# Patient Record
Sex: Female | Born: 1995
Health system: Southern US, Community
[De-identification: ages and names within clinical notes are randomized; demographics above are authoritative.]

---
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
## 2015-12-25 ENCOUNTER — Ambulatory Visit: Payer: Medicaid Other | Admitting: Family Medicine

## 2016-05-17 ENCOUNTER — Ambulatory Visit: Payer: Medicaid Other | Admitting: Obstetrics and Gynecology

## 2018-09-24 ENCOUNTER — Other Ambulatory Visit: Payer: Self-pay | Admitting: Family Medicine

## 2018-09-24 DIAGNOSIS — Z20822 Contact with and (suspected) exposure to covid-19: Secondary | ICD-10-CM

## 2018-09-27 LAB — NOVEL CORONAVIRUS, NAA: SARS-CoV-2, NAA: NOT DETECTED

## 2019-02-07 ENCOUNTER — Other Ambulatory Visit: Payer: Self-pay

## 2019-02-07 DIAGNOSIS — Z20822 Contact with and (suspected) exposure to covid-19: Secondary | ICD-10-CM

## 2019-02-10 LAB — NOVEL CORONAVIRUS, NAA: SARS-CoV-2, NAA: NOT DETECTED

## 2019-03-21 ENCOUNTER — Encounter: Payer: Self-pay | Admitting: Obstetrics and Gynecology

## 2019-04-11 ENCOUNTER — Telehealth: Payer: Self-pay

## 2019-04-11 ENCOUNTER — Encounter: Payer: BC Managed Care – PPO | Admitting: Obstetrics and Gynecology

## 2019-04-11 NOTE — Progress Notes (Signed)
CHIEF COMPLAINT / HPI:  Chest pain Jearldean has been experiencing achy/sharp chest pain around the middle of her ribs for years.  She is decided to come in today because she believes she has recently noticed her heart occasionally skipping beats and she is concerned that she may have a heart problem.  She describes her current chest pain as an achy or dull pain that is usually around the middle of her ribs just below her breasts.  It is usually present on the right side of her chest just below her right breast and does not radiate.  Occasionally, this dull or achy pain does spread toward the center of her chest and has more recently been spreading to the left side of her chest as well directly under her breast.  This pain is not related to exertion, it is not related to eating, it occasionally gets worse with inspiration.  She has previously suspected this might be due to her large breasts or to her bras although, she believes that this problem started when she was very young before she developed breasts.  Changing the fit of her bra has reduced some of the shoulder pain that she has previously experienced (not a concern today).  She currently wears wireless bras and has been fitted for different bras previously.  She has an older sister who is currently considering having a breast reduction.  Her main concern is to make sure that she does not have a heart issue.  She realizes that her weight may be contributing to the pain she describes and is making an effort to lose weight.  She is currently being seen by bariatric doctor to help her with her weight loss goals.  Carpal tunnel syndrome This is not her primary concern, she has noted that she occasionally experiences tingling and numbness over her pointer finger and thumb of both hands.  This seems to happen a little bit more on the right side compared to the left.  This pain seems to be most prominent at night when she is lying down to go to bed although  she does notice it in the morning and takes a little while before her hands return to normal as she goes throughout her day.  PERTINENT  PMH / PSH: Obesity, anxiety, previous noncardiac chest pain   OBJECTIVE: BP 128/82   Pulse 88   LMP 01/10/2019 (LMP Unknown)   SpO2 98%    General: Well-appearing, obese young woman.  No acute distress.  Sitting comfortably on the exam table. Cardiac: Regular rate and rhythm, no murmurs/rubs/gallops.  Strong radial pulse. Chest: Reproducible tenderness with palpation along her bra line under her right breast and left breast with some tenderness to palpation over the epigastric region.  No evidence of skin changes, maceration, erythema. Neuro: Positive Phalen's test.  Negative Tinel's test  ASSESSMENT / PLAN:  Chest pain, non-cardiac Unclear etiology.  Likely MSK.  Very low suspicion for cardiac causes of this chest pain based on duration and character of the pain.  EKG was also unremarkable and showed no evidence of heart block or ischemia.  Physical exam did not support intertrigo or cellulitis.  Her history shows that she did have relief of previous shoulder pain with bra augmentation indicating that she has certainly had MSK symptoms from her macromastia.  One point against macromastia being the ultimate culprit that she believes this chest pain started before her breast development.  Ultimately, she was relieved to hear that it is incredibly  unlikely that this is heart related pain. -Unclear cause, not cardiac -Continue with weight loss goals with bariatric provider -Consider additional options for better fitting bras -Breast reduction is a possible future option  Carpal tunnel syndrome Right hand slightly more affected than the left.  Positive Phalen's test. -No responsibility clinic -Encouraged to buy wrist splints at a medical supply store or a local pharmacy -Where splints at night for at least the next 4 weeks or until symptoms improve      Matilde Haymaker, MD [place]

## 2019-04-11 NOTE — Telephone Encounter (Signed)
LVM for pt to call the office before tomorrow's appt. If pt calls, please ask her the COVID screening questions. Sunday Spillers, CMA

## 2019-04-12 ENCOUNTER — Ambulatory Visit (HOSPITAL_COMMUNITY)
Admission: RE | Admit: 2019-04-12 | Discharge: 2019-04-12 | Disposition: A | Discharge status: Home / Self Care | Payer: BC Managed Care – PPO | Source: Ambulatory Visit

## 2019-04-12 ENCOUNTER — Ambulatory Visit (HOSPITAL_COMMUNITY)
Admission: RE | Admit: 2019-04-12 | Discharge: 2019-04-12 | Disposition: A | Discharge status: Home / Self Care | Payer: BC Managed Care – PPO | Source: Ambulatory Visit | Attending: Family Medicine | Admitting: Family Medicine

## 2019-04-12 ENCOUNTER — Other Ambulatory Visit: Payer: Self-pay

## 2019-04-12 ENCOUNTER — Ambulatory Visit (INDEPENDENT_AMBULATORY_CARE_PROVIDER_SITE_OTHER): Payer: BC Managed Care – PPO | Admitting: Family Medicine

## 2019-04-12 VITALS — BP 128/82 | HR 88

## 2019-04-12 DIAGNOSIS — R002 Palpitations: Secondary | ICD-10-CM

## 2019-04-12 DIAGNOSIS — G5603 Carpal tunnel syndrome, bilateral upper limbs: Secondary | ICD-10-CM

## 2019-04-12 DIAGNOSIS — R0789 Other chest pain: Secondary | ICD-10-CM | POA: Diagnosis not present

## 2019-04-12 DIAGNOSIS — G5600 Carpal tunnel syndrome, unspecified upper limb: Secondary | ICD-10-CM | POA: Insufficient documentation

## 2019-04-12 MED ORDER — TRIAMCINOLONE ACETONIDE 0.1 % EX CREA: 1.0000 "application " | TOPICAL_CREAM | Freq: Two times a day (BID) | CUTANEOUS | 3 refills | Status: DC

## 2019-04-12 NOTE — Assessment & Plan Note (Signed)
Right hand slightly more affected than the left.  Positive Phalen's test. -No responsibility clinic -Encouraged to buy wrist splints at a medical supply store or a local pharmacy -Where splints at night for at least the next 4 weeks or until symptoms improve

## 2019-04-12 NOTE — Patient Instructions (Addendum)
I am sorry that you have been having this ongoing chest pain for so long.  Importantly, I do not think this is related to your heart.  There is not seem to be any skin changes think it is a skin problem.  Most likely, this is related to your breasts/bra.  I know you have already made adjustments to your bra fits and sizes.  If you have not done so already, I would recommend investing in several well fitted bras.  Sometimes, women who have issues related to their large breasts do elect to have breast reductions.  I am not telling you that you have to have this done I simply want you to know that this could be an option for you down the road.  Carpal tunnel syndrome: This will improve by wearing wrist splints at night.  I am sorry we do not seem to have any splints in the clinic right now.  I recommend going to any medical supply store and/or pharmacy.  Many pharmacies carry an expensive wrist splints.  I recommend wearing these wrist splints at night for at least the next 4 weeks.  Sometimes they are worn for as long as 12 weeks.

## 2019-04-12 NOTE — Assessment & Plan Note (Addendum)
Unclear etiology.  Likely MSK.  Very low suspicion for cardiac causes of this chest pain based on duration and character of the pain.  EKG was also unremarkable and showed no evidence of heart block or ischemia.  Physical exam did not support intertrigo or cellulitis.  Her history shows that she did have relief of previous shoulder pain with bra augmentation indicating that she has certainly had MSK symptoms from her macromastia.  One point against macromastia being the ultimate culprit that she believes this chest pain started before her breast development.  Ultimately, she was relieved to hear that it is incredibly unlikely that this is heart related pain. -Unclear cause, not cardiac -Continue with weight loss goals with bariatric provider -Consider additional options for better fitting bras -Breast reduction is a possible future option

## 2019-04-15 ENCOUNTER — Ambulatory Visit: Payer: BC Managed Care – PPO | Attending: Internal Medicine

## 2019-04-15 DIAGNOSIS — Z20822 Contact with and (suspected) exposure to covid-19: Secondary | ICD-10-CM | POA: Diagnosis not present

## 2019-04-16 LAB — NOVEL CORONAVIRUS, NAA: SARS-CoV-2, NAA: NOT DETECTED

## 2019-05-02 ENCOUNTER — Emergency Department (HOSPITAL_COMMUNITY)
Admission: EM | Admit: 2019-05-02 | Discharge: 2019-05-02 | Disposition: A | Discharge status: Home / Self Care | Payer: BC Managed Care – PPO | Attending: Emergency Medicine | Admitting: Emergency Medicine

## 2019-05-02 ENCOUNTER — Emergency Department (HOSPITAL_COMMUNITY): Payer: BC Managed Care – PPO

## 2019-05-02 ENCOUNTER — Encounter (HOSPITAL_COMMUNITY): Payer: Self-pay | Admitting: Emergency Medicine

## 2019-05-02 DIAGNOSIS — Z6841 Body Mass Index (BMI) 40.0 and over, adult: Secondary | ICD-10-CM | POA: Diagnosis not present

## 2019-05-02 DIAGNOSIS — G932 Benign intracranial hypertension: Secondary | ICD-10-CM

## 2019-05-02 DIAGNOSIS — E669 Obesity, unspecified: Secondary | ICD-10-CM | POA: Insufficient documentation

## 2019-05-02 DIAGNOSIS — H539 Unspecified visual disturbance: Secondary | ICD-10-CM | POA: Diagnosis not present

## 2019-05-02 DIAGNOSIS — Z20822 Contact with and (suspected) exposure to covid-19: Secondary | ICD-10-CM | POA: Insufficient documentation

## 2019-05-02 DIAGNOSIS — H4710 Unspecified papilledema: Secondary | ICD-10-CM | POA: Diagnosis not present

## 2019-05-02 DIAGNOSIS — Z79899 Other long term (current) drug therapy: Secondary | ICD-10-CM | POA: Diagnosis not present

## 2019-05-02 DIAGNOSIS — Z03818 Encounter for observation for suspected exposure to other biological agents ruled out: Secondary | ICD-10-CM | POA: Diagnosis not present

## 2019-05-02 LAB — PREGNANCY, URINE: Preg Test, Ur: NEGATIVE

## 2019-05-02 MED ORDER — GADOBUTROL 1 MMOL/ML IV SOLN
10.0000 mL | Freq: Once | INTRAVENOUS | Status: AC | PRN
  Administered 2019-05-02: 10 mL via INTRAVENOUS

## 2019-05-02 NOTE — ED Triage Notes (Signed)
Pt in POV. Sent by opthalmology for MRI, unable to schedule outpatient d/t insurance lapsing @ end of month. States they are concerned for optic nerve swelling.

## 2019-05-02 NOTE — Discharge Instructions (Addendum)
Call the family practice center to set up an appointment after you return.  Call [phone] option 3 to set up the lumbar puncture ideally for tomorrow.

## 2019-05-02 NOTE — ED Provider Notes (Signed)
[facility] EMERGENCY DEPARTMENT Provider Note   CSN: [id number] Arrival date & time: 05/02/19  1539     History Chief Complaint  Patient presents with  . Sent by Opthalmology    Rebecca Diaz is a 24 y.o. female.  HPI Patient sent in for MRI from ophthalmology.  Has swelling of her optic nerve.  Per note needs to rule out CNS mass central venous sinus thrombosis and idiopathic intracranial hypertension.  May also need short-term LP.  Has had vision changes recently but states she cannot really put a time on it because she is always had bad vision.  States she seemed sometimes to have depth perception and occasionally have a headache.  No fevers.  No urinary problems.  No confusion.    History reviewed. No pertinent past medical history.  Patient Active Problem List   Diagnosis Date Noted  . Carpal tunnel syndrome 04/12/2019  . Chest pain, non-cardiac 10/15/2014  . Recent history of foreign travel 10/15/2014  . Headache 03/11/2014  . PES PLANUS, CONGENITAL 03/29/2010  . POLYCYSTIC OVARIES 08/04/2009  . ANXIETY, SITUATIONAL 08/04/2009  . ECZEMA, ATOPIC 08/04/2009  . OTHER SPECIFIED DISEASE OF NAIL 08/04/2009  . TESTOSTERONE, SERUM, ELEVATED 07/25/2009  . AMENORRHEA, SECONDARY 07/22/2009  . OBESITY, NOS 05/04/2006    History reviewed. No pertinent surgical history.   OB History   No obstetric history on file.     No family history on file.  Social History   Tobacco Use  . Smoking status: Never Smoker  . Smokeless tobacco: Never Used  Substance Use Topics  . Alcohol use: Yes    Comment: Socially   . Drug use: Never    Home Medications Prior to Admission medications   Medication Sig Start Date End Date Taking? Authorizing Provider  Carboxymethylcellulose Sodium (THERATEARS OP) Place 1 drop into both eyes as needed (dry eyes).   Yes [provider]  Cyanocobalamin (VITAMIN B12 PO) Take 1 tablet by mouth daily.   Yes [provider]  phentermine 15 MG capsule Take 15 mg by mouth every morning.   Yes [provider]  Prenatal Vit-Fe Fumarate-FA (PRENATAL PO) Take 1-2 tablets by mouth daily.   Yes [provider]  triamcinolone cream (KENALOG) 0.1 % Apply 1 application topically 2 (two) times daily. 04/12/19  Yes Mirian Mo, MD    Allergies    Patient has no known allergies.  Review of Systems   Review of Systems  Constitutional: Negative for appetite change.  HENT: Negative for congestion.   Eyes: Positive for visual disturbance.  Respiratory: Negative for shortness of breath.   Gastrointestinal: Negative for abdominal pain.  Genitourinary: Negative for dysuria.  Musculoskeletal: Negative for back pain.  Skin: Negative for rash.  Neurological: Positive for headaches. Negative for weakness.  Psychiatric/Behavioral: Negative for confusion.    Physical Exam Updated Vital Signs BP (!) 143/91   Pulse 87   Temp 98.1 F (36.7 C) (Oral)   Resp 18   Ht 5\' 1"  (1.549 m)   Wt 131.5 kg   LMP 01/10/2019 (LMP Unknown)   SpO2 100%   BMI 54.80 kg/m   Physical Exam Vitals and nursing note reviewed.  Constitutional:      Appearance: She is obese.  HENT:     Head: Atraumatic.  Eyes:     Extraocular Movements: Extraocular movements intact.     Pupils: Pupils are equal, round, and reactive to light.  Cardiovascular:     Rate and  Rhythm: Normal rate.  Skin:    General: Skin is warm.  Neurological:     Mental Status: She is alert and oriented to person, place, and time.     ED Results / Procedures / Treatments   Labs (all labs ordered are listed, but only abnormal results are displayed) Labs Reviewed  SARS CORONAVIRUS 2 (TAT 6-24 HRS)  PREGNANCY, URINE    EKG None  Radiology MR Brain W and Wo Contrast  Result Date: 05/02/2019 CLINICAL DATA:  Optic nerve edema EXAM: MRI HEAD WITHOUT AND WITH CONTRAST MRV HEAD WITHOUT AND WITH CONTRAST TECHNIQUE: Multiplanar, multiecho  pulse sequences of the brain and surrounding structures were obtained without and with intravenous contrast. Angiographic images of the intracranial venous structures were obtained using MRV technique without and with intravenous contrast. CONTRAST:  33mL GADAVIST GADOBUTROL 1 MMOL/ML IV SOLN COMPARISON:  None. FINDINGS: MRI brain: Brain: There is no acute infarction or intracranial hemorrhage. There is no intracranial mass, mass effect, or edema. There is no hydrocephalus or extra-axial fluid collection. No abnormal enhancement. Vascular: Major vessel flow voids at the skull base are preserved. Skull and upper cervical spine: Normal marrow signal is preserved. Sinuses/Orbits: Minor mucosal thickening. Orbits are unremarkable on this nondedicated examination. Other: Sella is partially empty.  Mastoid air cells are clear. MRV head: Superior sagittal sinus, straight sinus, vein of Galen, internal cerebral veins, and both transverse and sigmoid sinuses are patent. Right transverse sinus is dominant. There is stenosis of bilateral distal transverse sinuses. No evidence of venous thrombosis. IMPRESSION: No intracranial mass. Orbits are unremarkable on this nondedicated examination. No evidence of venous thrombosis. Constellation of findings suggestive of idiopathic intracranial hypertension. Electronically Signed   By: Macy Mis M.D.   On: 05/02/2019 21:29   MR MRV HEAD W WO CONTRAST  Result Date: 05/02/2019 CLINICAL DATA:  Optic nerve edema EXAM: MRI HEAD WITHOUT AND WITH CONTRAST MRV HEAD WITHOUT AND WITH CONTRAST TECHNIQUE: Multiplanar, multiecho pulse sequences of the brain and surrounding structures were obtained without and with intravenous contrast. Angiographic images of the intracranial venous structures were obtained using MRV technique without and with intravenous contrast. CONTRAST:  26mL GADAVIST GADOBUTROL 1 MMOL/ML IV SOLN COMPARISON:  None. FINDINGS: MRI brain: Brain: There is no acute infarction  or intracranial hemorrhage. There is no intracranial mass, mass effect, or edema. There is no hydrocephalus or extra-axial fluid collection. No abnormal enhancement. Vascular: Major vessel flow voids at the skull base are preserved. Skull and upper cervical spine: Normal marrow signal is preserved. Sinuses/Orbits: Minor mucosal thickening. Orbits are unremarkable on this nondedicated examination. Other: Sella is partially empty.  Mastoid air cells are clear. MRV head: Superior sagittal sinus, straight sinus, vein of Galen, internal cerebral veins, and both transverse and sigmoid sinuses are patent. Right transverse sinus is dominant. There is stenosis of bilateral distal transverse sinuses. No evidence of venous thrombosis. IMPRESSION: No intracranial mass. Orbits are unremarkable on this nondedicated examination. No evidence of venous thrombosis. Constellation of findings suggestive of idiopathic intracranial hypertension. Electronically Signed   By: Macy Mis M.D.   On: 05/02/2019 21:29    Procedures Procedures (including critical care time)  Medications Ordered in ED Medications  gadobutrol (GADAVIST) 1 MMOL/ML injection 10 mL (10 mLs Intravenous Contrast Given 05/02/19 2112)    ED Course  I have reviewed the triage vital signs and the nursing notes.  Pertinent labs & imaging results that were available during my care of the patient were reviewed by me and  considered in my medical decision making (see chart for details).    MDM Rules/Calculators/A&P                      Patient sent in for optic disc swelling.  MRI imaging done per ophthalmology recommendations.  Shows no mass but likely shows idiopathic intracranial hypertension.  I have ordered outpatient fluoroscopic lumbar puncture.  Patient will call scheduling for procedure tomorrow.  Also discussed with family practice residents, who will follow the results and start her on medicines if pressure is elevated.  They also can arrange  follow-up.  Covid test and urine pregnancy test done although she denies pregnancy and has had a Covid test -3 weeks ago.  No Covid symptoms.  Discharge home. Final Clinical Impression(s) / ED Diagnoses Final diagnoses:  IIH (idiopathic intracranial hypertension)    Rx / DC Orders ED Discharge Orders         Ordered    DG FL GUIDED LUMBAR PUNCTURE    Comments: Patient needs opening pressure for diagnosis of idiopathic intracranial hypertension.  Potentially also could be therapeutic if pressure is elevated.   05/02/19 2207           Benjiman Core, MD 05/02/19 [phone]

## 2019-05-03 LAB — SARS CORONAVIRUS 2 (TAT 6-24 HRS): SARS Coronavirus 2: NEGATIVE

## 2019-05-09 ENCOUNTER — Encounter: Payer: BC Managed Care – PPO | Admitting: Obstetrics and Gynecology

## 2019-05-10 ENCOUNTER — Ambulatory Visit (HOSPITAL_COMMUNITY): Admission: RE | Admit: 2019-05-10 | Payer: BC Managed Care – PPO | Source: Ambulatory Visit

## 2019-05-13 DIAGNOSIS — Z20828 Contact with and (suspected) exposure to other viral communicable diseases: Secondary | ICD-10-CM | POA: Diagnosis not present

## 2019-05-20 ENCOUNTER — Ambulatory Visit (HOSPITAL_COMMUNITY)
Admission: RE | Admit: 2019-05-20 | Discharge: 2019-05-20 | Disposition: A | Discharge status: Home / Self Care | Payer: BC Managed Care – PPO | Source: Ambulatory Visit | Attending: Emergency Medicine | Admitting: Emergency Medicine

## 2019-05-20 ENCOUNTER — Other Ambulatory Visit: Payer: Self-pay

## 2019-05-20 DIAGNOSIS — H4710 Unspecified papilledema: Secondary | ICD-10-CM | POA: Diagnosis not present

## 2019-05-20 LAB — PROTEIN, CSF: Total  Protein, CSF: 21 mg/dL (ref 15–45)

## 2019-05-20 LAB — GLUCOSE, CSF: Glucose, CSF: 61 mg/dL (ref 40–70)

## 2019-05-20 MED ORDER — LIDOCAINE HCL (PF) 1 % IJ SOLN: 5.0000 mL | Freq: Once | INTRAMUSCULAR | Status: DC

## 2019-05-20 NOTE — Progress Notes (Signed)
Discharge instructions reviewed with patient and family. Verbalized understanding. 

## 2019-05-20 NOTE — Discharge Instructions (Signed)
Lumbar Puncture, Care After This sheet gives you information about how to care for yourself after your procedure. Your health care provider may also give you more specific instructions. If you have problems or questions, contact your health care provider. What can I expect after the procedure? After the procedure, it is common to have:  Mild discomfort or pain at the puncture site.  A mild headache that is relieved with pain medicines. Follow these instructions at home: Activity   Lie down flat or rest for as long as directed by your health care provider.  Return to your normal activities as told by your health care provider. Ask your health care provider what activities are safe for you.  Avoid lifting anything heavier than 10 lb (4.5 kg) for at least 12 hours after the procedure.  Do not drive for 24 hours if you were given a medicine to help you relax (sedative) during your procedure.  Do not drive or use heavy machinery while taking prescription pain medicine. Puncture site care  Remove or change your bandage (dressing) as told by your health care provider.  Check your puncture area every day for signs of infection. Check for: ? More pain. ? Redness or swelling. ? Fluid or blood leaking from the puncture site. ? Warmth. ? Pus or a bad smell. General instructions  Take over-the-counter and prescription medicines only as told by your health care provider.  Drink enough fluids to keep your urine clear or pale yellow. Your health care provider may recommend drinking caffeine to prevent a headache.  Keep all follow-up visits as told by your health care provider. This is important. Contact a health care provider if:  You have fever or chills.  You have nausea or vomiting.  You have a headache that lasts for more than 2 days or does not get better with medicine. Get help right away if:  You develop any of the following in your  legs: ? Weakness. ? Numbness. ? Tingling.  You are unable to control when you urinate or have a bowel movement (incontinence).  You have signs of infection around your puncture site, such as: ? More pain. ? Redness or swelling. ? Fluid or blood leakage. ? Warmth. ? Pus or a bad smell.  You are dizzy or you feel like you might faint.  You have a severe headache, especially when you sit or stand. Summary  A lumbar puncture is a procedure in which a small needle is inserted into the lower back to remove fluid that surrounds the brain and spinal cord.  After this procedure, it is common to have a headache and pain around the needle insertion area.  Lying flat, staying hydrated, and drinking caffeine can help prevent headaches.  Monitor your needle insertion site for signs of infection, including warmth, fluid, or more pain.  Get help right away if you develop leg weakness, leg numbness, incontinence, or severe headaches. This information is not intended to replace advice given to you by your health care provider. Make sure you discuss any questions you have with your health care provider. Document Revised: 04/06/2016 Document Reviewed: 04/06/2016 Elsevier Patient Education  2020 [employer]vier Inc.  

## 2019-05-20 NOTE — Procedures (Signed)
Reason for procedure: edema of optic disc, concern for idiopathic intracranial hypertension  Successful fluoro-guided LP at L1-2 Opening pressure 22 cm water 21 mL clear CSF withdrawn and sent for laboratory eval Closing pressure 5 cm water No immediate complications  Rito Ehrlich, MD

## 2019-05-21 LAB — CSF CELL COUNT WITH DIFFERENTIAL
RBC Count, CSF: 0 /mm3
Tube #: 3
WBC, CSF: 0 /mm3 (ref 0–5)

## 2019-05-23 LAB — CSF CULTURE W GRAM STAIN
Culture: NO GROWTH
Gram Stain: NONE SEEN

## 2019-05-30 DIAGNOSIS — H4710 Unspecified papilledema: Secondary | ICD-10-CM | POA: Diagnosis not present

## 2019-06-04 ENCOUNTER — Other Ambulatory Visit: Payer: Self-pay

## 2019-06-04 ENCOUNTER — Encounter: Payer: Self-pay | Admitting: Family Medicine

## 2019-06-04 ENCOUNTER — Ambulatory Visit (INDEPENDENT_AMBULATORY_CARE_PROVIDER_SITE_OTHER): Payer: BC Managed Care – PPO | Admitting: Family Medicine

## 2019-06-04 ENCOUNTER — Other Ambulatory Visit (HOSPITAL_COMMUNITY)
Admission: RE | Admit: 2019-06-04 | Discharge: 2019-06-04 | Disposition: A | Discharge status: Home / Self Care | Payer: BC Managed Care – PPO | Source: Ambulatory Visit | Attending: Family Medicine | Admitting: Family Medicine

## 2019-06-04 VITALS — BP 110/80 | HR 82 | Temp 98.2°F | Wt 290.0 lb

## 2019-06-04 DIAGNOSIS — Z124 Encounter for screening for malignant neoplasm of cervix: Secondary | ICD-10-CM | POA: Diagnosis not present

## 2019-06-04 DIAGNOSIS — Z23 Encounter for immunization: Secondary | ICD-10-CM | POA: Diagnosis not present

## 2019-06-04 DIAGNOSIS — Z0000 Encounter for general adult medical examination without abnormal findings: Secondary | ICD-10-CM

## 2019-06-04 DIAGNOSIS — G932 Benign intracranial hypertension: Secondary | ICD-10-CM

## 2019-06-04 DIAGNOSIS — R5383 Other fatigue: Secondary | ICD-10-CM | POA: Diagnosis not present

## 2019-06-04 NOTE — Patient Instructions (Signed)
It was great seeing you today!   I'd like to see you back in 6 months for follow up but if you need to be seen earlier than that for any new issues we're happy to fit you in, just give [place] a call!  - It is important for you to KEEP your opthalmology appointments even if you lose insurance!!!!  - I will send you a myChart message regarding you labs in the next week.    If you have questions or concerns please do not hesitate to call at [phone].  Dr. Katherina [facility]

## 2019-06-04 NOTE — Progress Notes (Signed)
    SUBJECTIVE:   CHIEF COMPLAINT / HPI:   Rebecca Diaz is a 24 y.o. female with history of PCOS here for follow up and PAP smear.  Idiopathic intracranial hypertension Seen in ED and was told she has "pressure on optic nerves." Seen opthalmology but is losing her health insurance.  States she "doesn't want to lose my vision for another 10 years" and her "mom lost her vision" at an early age.  Denies headaches and changes in vision. Had an LP and would like to know the results. Has PCOS.     Obesity  Patient trying to lose weight and follows with a bariatric clinic. Has lost 5-10 lbs since February.  Working on exercising and altering her diet. Reports fatigue. Has familiar history of sickle cell trait.   PERTINENT  PMH / PSH: PCOS, idiopathic intracranial hypertension   OBJECTIVE:   Wt 290 lb (131.5 kg)   LMP 05/17/2019   BMI 54.80 kg/m    GEN: pleasant female, in no acute distress  CV: regular rate and rhythm, no murmurs appreciated  RESP: no increased work of breathing, clear to ascultation bilaterally  ABD:  Obese abdomen, bowel sounds present. Soft, Nontender, Nondistended.  MSK: normal ROM, non-tender, strength 5/5 bilateral LE and UE  SKIN: warm, dry, no rash NEURO: normal tone, CN 2-12 grossly intact  PSYCH: Normal affect and thought content  Pelvic: Pelvic exam: VULVA: normal appearing vulva with no masses, tenderness or lesions, VAGINA: normal appearing vagina with normal color, vaginal bleeding, clots near cervical os, CERVIX: normal appearing cervix without discharge or lesions, ADNEXA/UTERUS: non-tender, grossly normal, no palpable masses though exam limited due to body habitus, PAP: Pap smear done today, exam chaperoned by CMA.   ASSESSMENT/PLAN:   Morbid obesity (HCC) Obtain lipid panel, a1c and CMP.  Patient follows with bariatric clinic and is considering weight loss surgery.     Idiopathic intracranial hypertension Reviewed CSF results negative and were  reviewed with patient.  Advised patient to keep her ophthalmology appointments to prevent blindness due to papilledema.  Patient agrees with plan.    PAP smear obtained and HIV test ordered.  Tetanus vaccination given.   Katha Cabal, DO [facility]

## 2019-06-04 NOTE — Assessment & Plan Note (Signed)
Obtain lipid panel, a1c and CMP.  Patient follows with bariatric clinic and is considering weight loss surgery.

## 2019-06-05 LAB — COMPREHENSIVE METABOLIC PANEL
ALT: 8 IU/L (ref 0–32)
AST: 14 IU/L (ref 0–40)
Albumin/Globulin Ratio: 1.4 (ref 1.2–2.2)
Albumin: 4.5 g/dL (ref 3.9–5.0)
Alkaline Phosphatase: 91 IU/L (ref 39–117)
BUN/Creatinine Ratio: 11 (ref 9–23)
BUN: 10 mg/dL (ref 6–20)
Bilirubin Total: 0.2 mg/dL (ref 0.0–1.2)
CO2: 21 mmol/L (ref 20–29)
Calcium: 9.9 mg/dL (ref 8.7–10.2)
Chloride: 103 mmol/L (ref 96–106)
Creatinine, Ser: 0.87 mg/dL (ref 0.57–1.00)
GFR calc Af Amer: 108 mL/min/{1.73_m2} (ref >59)
GFR calc non Af Amer: 94 mL/min/{1.73_m2} (ref >59)
Globulin, Total: 3.3 g/dL (ref 1.5–4.5)
Glucose: 74 mg/dL (ref 65–99)
Potassium: 4.4 mmol/L (ref 3.5–5.2)
Sodium: 139 mmol/L (ref 134–144)
Total Protein: 7.8 g/dL (ref 6.0–8.5)

## 2019-06-05 LAB — CBC WITH DIFFERENTIAL/PLATELET
Basophils Absolute: 0.1 10*3/uL (ref 0.0–0.2)
Basos: 1 %
EOS (ABSOLUTE): 0.1 10*3/uL (ref 0.0–0.4)
Eos: 1 %
Hematocrit: 39.5 % (ref 34.0–46.6)
Hemoglobin: 12.5 g/dL (ref 11.1–15.9)
Immature Grans (Abs): 0 10*3/uL (ref 0.0–0.1)
Immature Granulocytes: 0 %
Lymphocytes Absolute: 4.8 10*3/uL — ABNORMAL HIGH (ref 0.7–3.1)
Lymphs: 44 %
MCH: 22.6 pg — ABNORMAL LOW (ref 26.6–33.0)
MCHC: 31.6 g/dL (ref 31.5–35.7)
MCV: 71 fL — ABNORMAL LOW (ref 79–97)
Monocytes Absolute: 0.5 10*3/uL (ref 0.1–0.9)
Monocytes: 5 %
Neutrophils Absolute: 5.5 10*3/uL (ref 1.4–7.0)
Neutrophils: 49 %
Platelets: 483 10*3/uL — ABNORMAL HIGH (ref 150–450)
RBC: 5.53 x10E6/uL — ABNORMAL HIGH (ref 3.77–5.28)
RDW: 18.8 % — ABNORMAL HIGH (ref 11.7–15.4)
WBC: 11 10*3/uL — ABNORMAL HIGH (ref 3.4–10.8)

## 2019-06-05 LAB — HEMOGLOBIN A1C
Est. average glucose Bld gHb Est-mCnc: 111 mg/dL
Hgb A1c MFr Bld: 5.5 % (ref 4.8–5.6)

## 2019-06-05 LAB — LIPID PANEL
Chol/HDL Ratio: 4.4 ratio (ref 0.0–4.4)
Cholesterol, Total: 185 mg/dL (ref 100–199)
HDL: 42 mg/dL (ref >39)
LDL Chol Calc (NIH): 126 mg/dL — ABNORMAL HIGH (ref 0–99)
Triglycerides: 90 mg/dL (ref 0–149)
VLDL Cholesterol Cal: 17 mg/dL (ref 5–40)

## 2019-06-05 LAB — HIV ANTIBODY (ROUTINE TESTING W REFLEX): HIV Screen 4th Generation wRfx: NONREACTIVE

## 2019-06-06 DIAGNOSIS — G932 Benign intracranial hypertension: Secondary | ICD-10-CM | POA: Insufficient documentation

## 2019-06-06 LAB — CYTOLOGY - PAP
Chlamydia: NEGATIVE
Comment: NEGATIVE
Comment: NEGATIVE
Comment: NORMAL
Diagnosis: NEGATIVE
High risk HPV: NEGATIVE
Neisseria Gonorrhea: NEGATIVE

## 2019-06-06 NOTE — Assessment & Plan Note (Signed)
Reviewed CSF results negative and were reviewed with patient.  Advised patient to keep her ophthalmology appointments to prevent blindness due to papilledema.  Patient agrees with plan.

## 2019-06-27 DIAGNOSIS — H4710 Unspecified papilledema: Secondary | ICD-10-CM | POA: Diagnosis not present

## 2019-07-03 DIAGNOSIS — G932 Benign intracranial hypertension: Secondary | ICD-10-CM | POA: Diagnosis not present

## 2021-08-10 ENCOUNTER — Encounter: Payer: Self-pay | Admitting: *Deleted

## 2022-01-10 IMAGING — MR MR MRV HEAD WO/W CM
4 series · 48 of 48 positions shown · IV contrast (gadavist)
Comparison: None.

CLINICAL DATA: Optic nerve edema

EXAM:
MRI HEAD WITHOUT AND WITH CONTRAST
MRV HEAD WITHOUT AND WITH CONTRAST
TECHNIQUE: Multiplanar, multiecho pulse sequences of the brain and surrounding
structures were obtained without and with intravenous contrast.
Angiographic images of the intracranial venous structures were
obtained using MRV technique without and with intravenous contrast.
CONTRAST:  10mL GADAVIST GADOBUTROL 1 MMOL/ML IV SOLN

[Series 1: tof_fl2d_paracor · coronal · 2.0mm · 0.98mm/px · 10 of 155 slices shown]
[im 1/155]
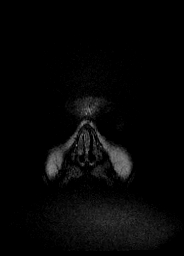
[im 18/155]
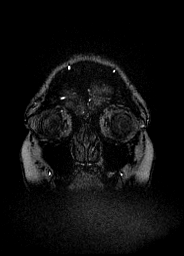
[im 35/155]
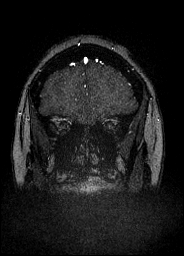
[im 52/155]
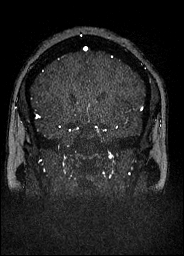
[im 69/155]
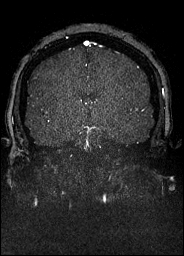
[im 86/155]
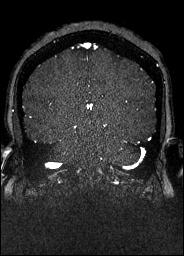
[im 103/155]
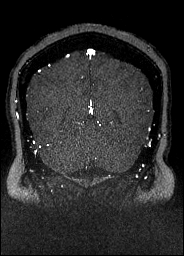
[im 120/155]
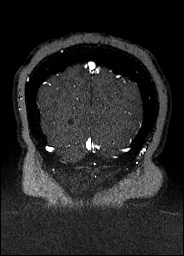
[im 137/155]
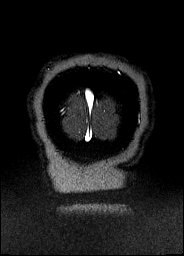
[im 155/155]
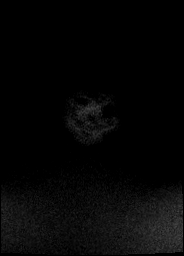

[Series 6: venous inhance coronal_msum · coronal · portal-venous · 0.9mm · 0.57mm/px · 15 of 224 slices shown]
[im 1/224]
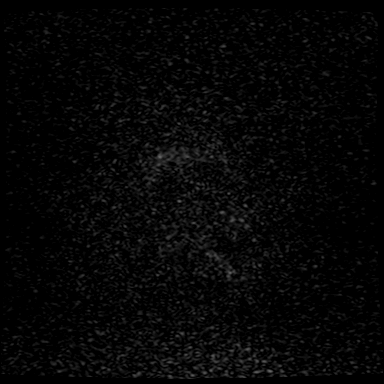
[im 16/224]
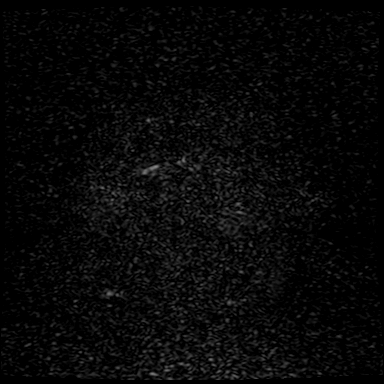
[im 32/224]
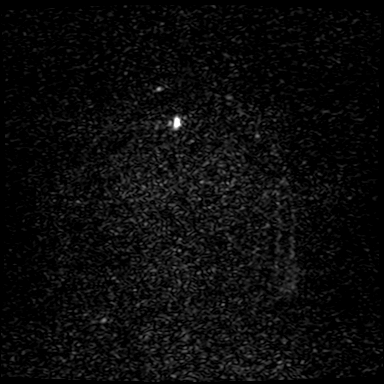
[im 48/224]
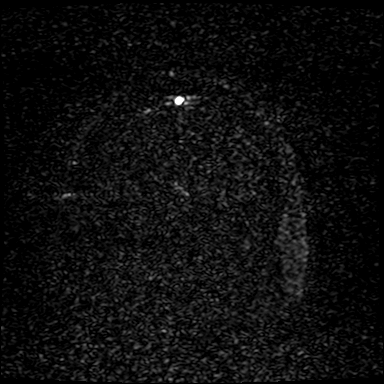
[im 64/224]
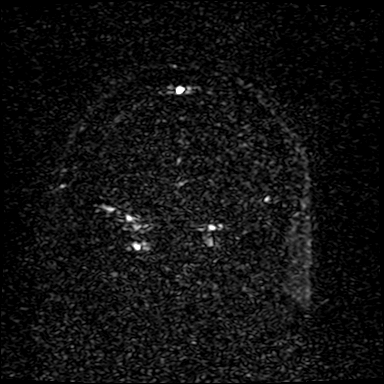
[im 80/224]
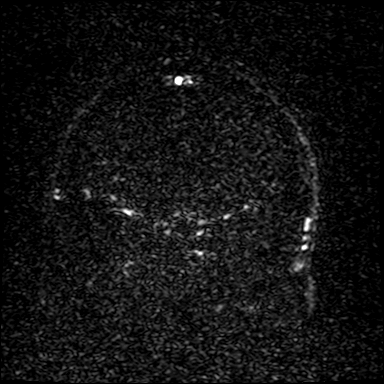
[im 96/224]
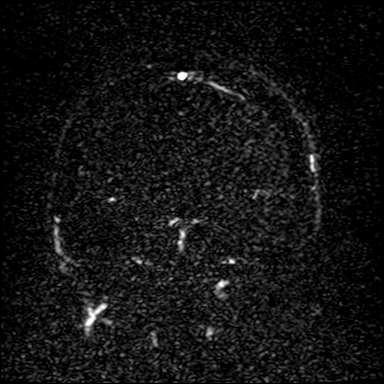
[im 112/224]
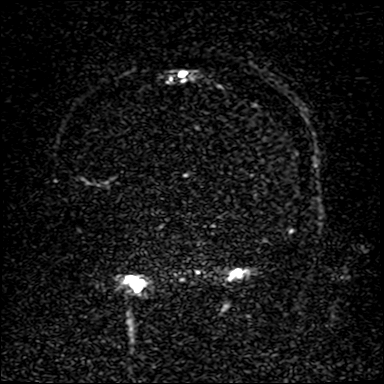
[im 128/224]
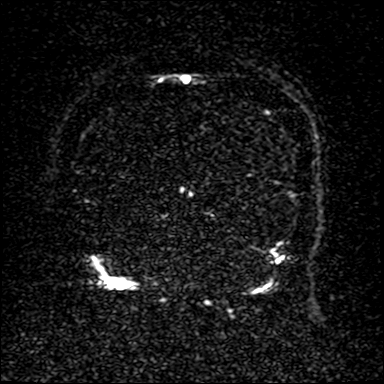
[im 144/224]
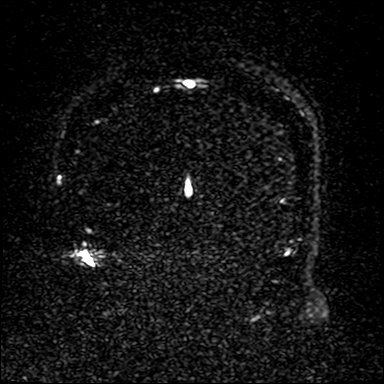
[im 160/224]
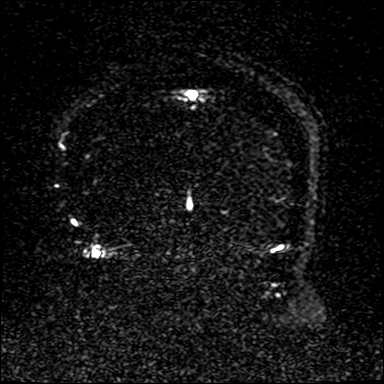
[im 176/224]
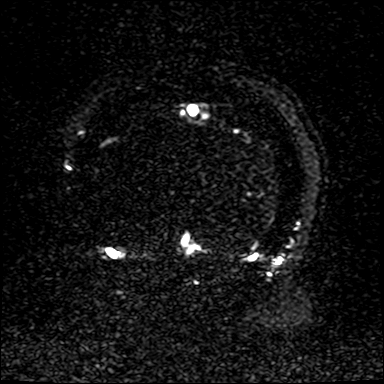
[im 192/224]
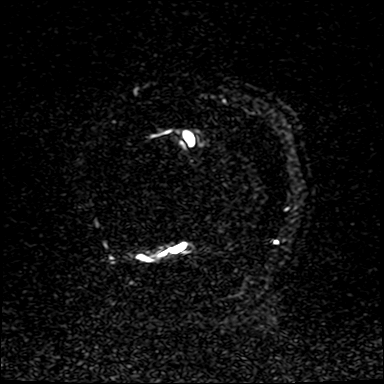
[im 208/224]
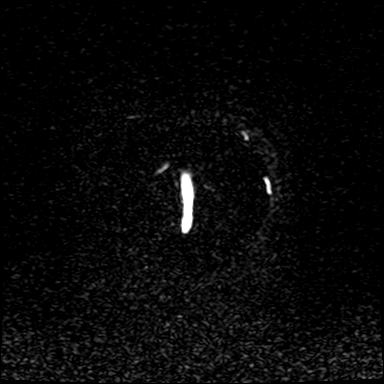
[im 224/224]
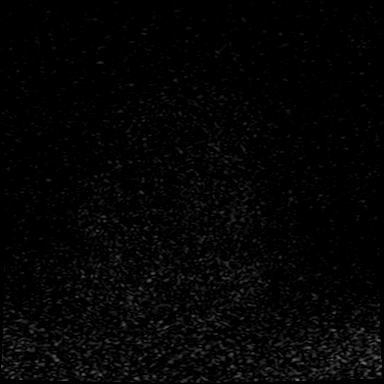

[Series 9: t1_fl3d_sag_p2_iso · sagittal · 1.0mm · 0.98mm/px · 12 of 176 slices shown]
[im 1/176]
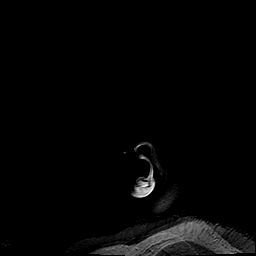
[im 16/176]
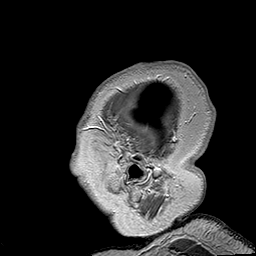
[im 32/176]
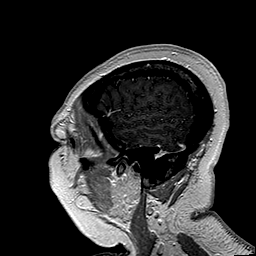
[im 48/176]
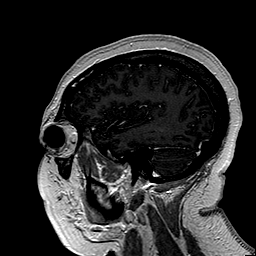
[im 64/176]
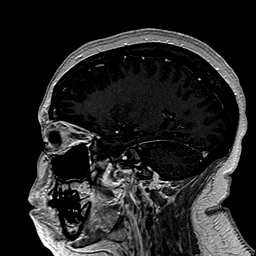
[im 80/176]
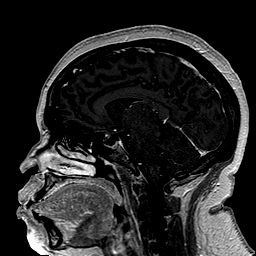
[im 96/176]
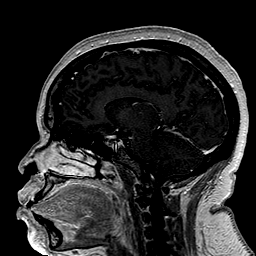
[im 112/176]
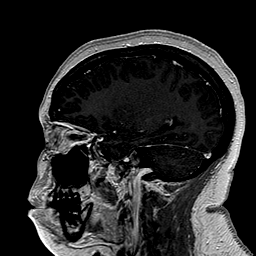
[im 128/176]
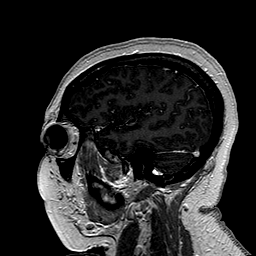
[im 144/176]
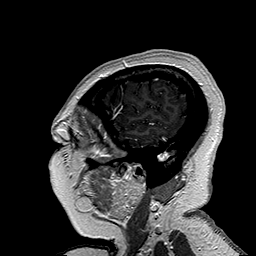
[im 160/176]
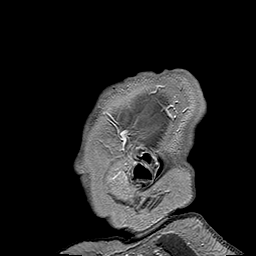
[im 176/176]
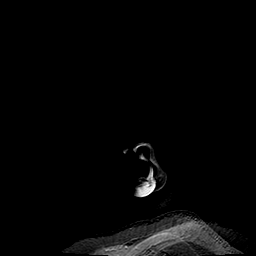

[Series 10: t1_fl3d_tra_p2_iso · axial · 1.0mm · 0.98mm/px · z∈[-96,+60]mm · 11 of 160 slices shown]
[im 1/160]
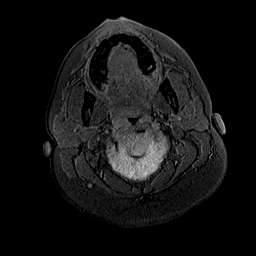
[im 16/160]
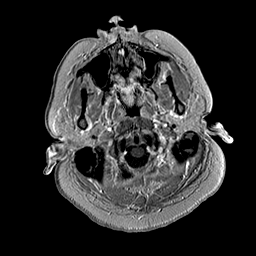
[im 32/160]
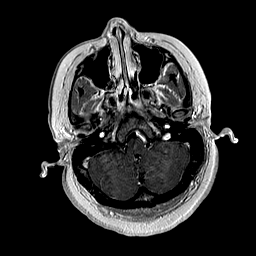
[im 48/160]
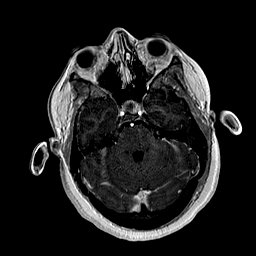
[im 64/160]
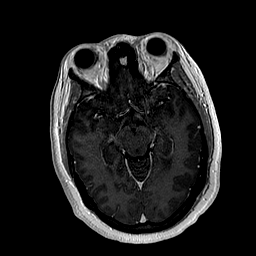
[im 80/160]
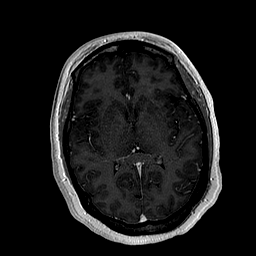
[im 96/160]
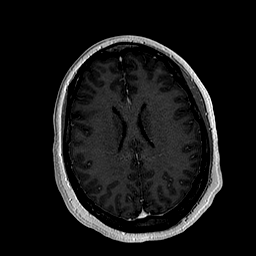
[im 112/160]
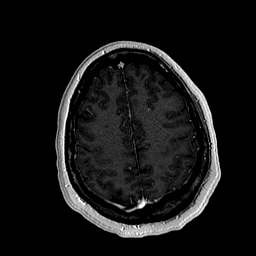
[im 128/160]
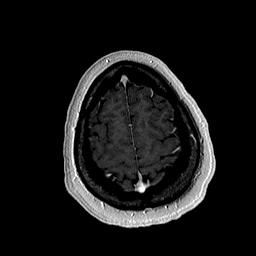
[im 144/160]
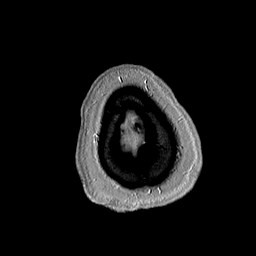
[im 160/160]
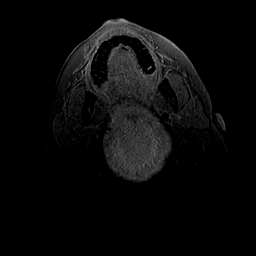

[48 of 48 positions shown; findings below may reference images not displayed]

FINDINGS: MRI brain:

Brain: There is no acute infarction or intracranial hemorrhage.
There is no intracranial mass, mass effect, or edema. There is no
hydrocephalus or extra-axial fluid collection. No abnormal
enhancement.

Vascular: Major vessel flow voids at the skull base are preserved.

Skull and upper cervical spine: Normal marrow signal is preserved.

Sinuses/Orbits: Minor mucosal thickening. Orbits are unremarkable on
this nondedicated examination.

Other: Sella is partially empty.  Mastoid air cells are clear.

MRV head:

Superior sagittal sinus, straight sinus, vein of [place], internal
cerebral veins, and both transverse and sigmoid sinuses are patent.
Right transverse sinus is dominant. There is stenosis of bilateral
distal transverse sinuses. No evidence of venous thrombosis.
IMPRESSION: No intracranial mass. Orbits are unremarkable on this nondedicated
examination. No evidence of venous thrombosis.

Constellation of findings suggestive of idiopathic intracranial
hypertension.

## 2022-01-28 IMAGING — RF DG SPINAL PUNCT LUMBAR DIAG WITH FL CT GUIDANCE
1 series · 1 of 1 positions shown · non-contrast
Comparison: none

CLINICAL DATA: Edema of optic disc, possible idiopathic
intracranial hypertension.

[Series 1: cp_standard · 0.17mm/px · 1 of 1 slices shown]
[im 1/1]
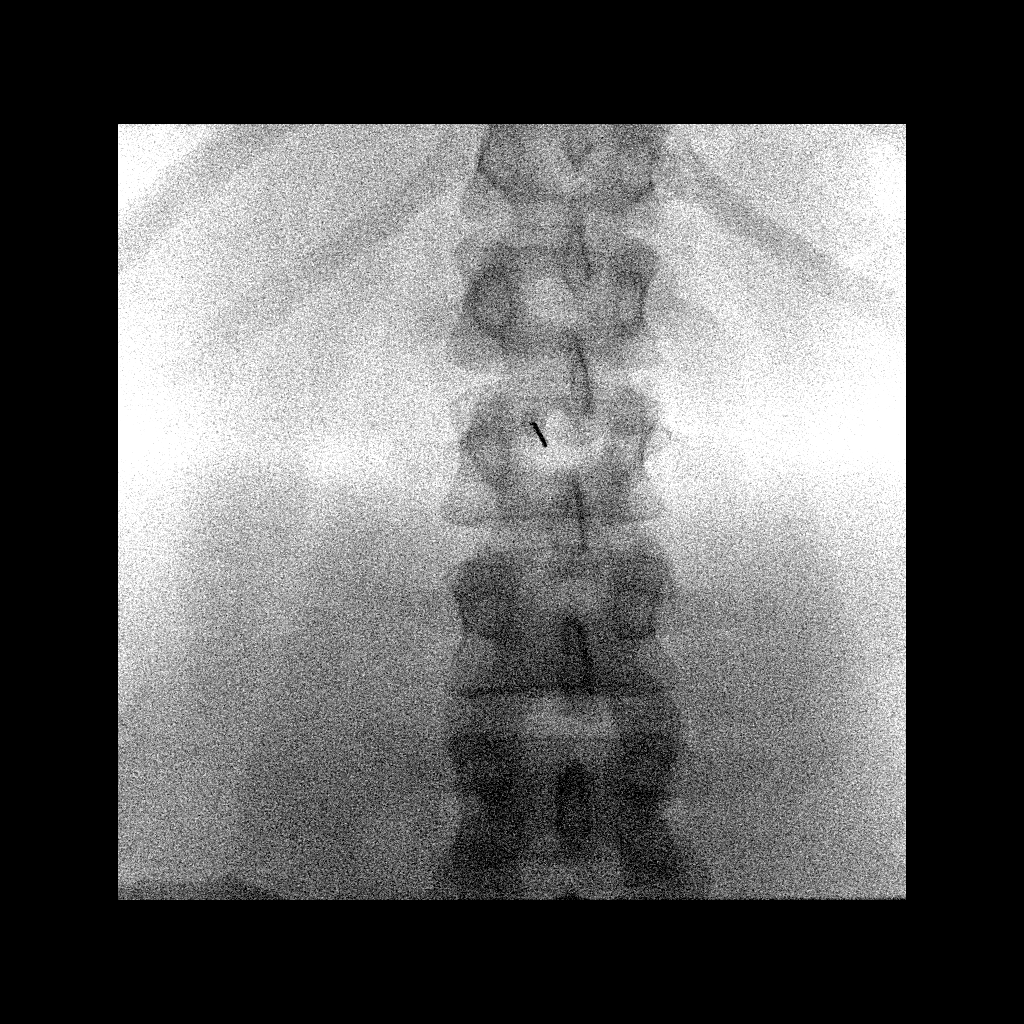

[1 of 1 positions shown; findings below may reference images not displayed]

EXAM:
DIAGNOSTIC LUMBAR PUNCTURE UNDER FLUOROSCOPIC GUIDANCE

FLUOROSCOPY TIME:  Fluoroscopy Time:  12 seconds

Radiation Exposure Index (if provided by the fluoroscopic device):
2.2 mGy

Number of Acquired Spot Images: 1

PROCEDURE:
Informed consent was obtained from the patient prior to the
procedure, including potential complications of headache, allergy,
and pain. With the patient prone, the lower back was prepped with
Betadine. 1% Lidocaine was used for local anesthesia. Lumbar
puncture was performed at the L1-2 level using a 20 gauge needle
with return of clear CSF with an opening pressure of 22 cm water. 21
ml of CSF were obtained for laboratory studies. Closing pressure 5
cm water. The patient tolerated the procedure well and there were no
apparent complications.
IMPRESSION: Successful fluoroscopic guided lumbar puncture, as above.

## 2022-06-15 ENCOUNTER — Encounter: Payer: Self-pay | Admitting: Student

## 2022-06-15 ENCOUNTER — Ambulatory Visit: Payer: Medicaid Other | Admitting: Student

## 2022-06-15 VITALS — BP 118/78 | HR 80 | Ht 61.0 in | Wt 288.5 lb

## 2022-06-15 DIAGNOSIS — G932 Benign intracranial hypertension: Secondary | ICD-10-CM | POA: Diagnosis present

## 2022-06-15 DIAGNOSIS — L309 Dermatitis, unspecified: Secondary | ICD-10-CM | POA: Diagnosis not present

## 2022-06-15 DIAGNOSIS — Z1159 Encounter for screening for other viral diseases: Secondary | ICD-10-CM | POA: Diagnosis not present

## 2022-06-15 DIAGNOSIS — Z23 Encounter for immunization: Secondary | ICD-10-CM

## 2022-06-15 MED ORDER — TRIAMCINOLONE ACETONIDE 0.1 % EX CREA: 1.0000 | TOPICAL_CREAM | Freq: Two times a day (BID) | CUTANEOUS | 3 refills | Status: DC

## 2022-06-15 NOTE — Addendum Note (Signed)
Addended by: Aquilla Solian on: 06/15/2022 10:18 AM   Modules accepted: Orders

## 2022-06-15 NOTE — Progress Notes (Signed)
  SUBJECTIVE:   CHIEF COMPLAINT / HPI:   IIH Previously diagnosed with papilledema and had a normal opening pressure on LP (22) a couple of years ago.  MRI at that time was showing concern for IIH.  She was started on Diamox 250 mg 2 tabs twice a day for which she reports her ophthalmologist is controlling.  She was on an increased dosage for approximately 6 months after diagnosis but had reduced due to nausea and side effects.  Patient sees Dr. Clelia Croft with [facility].  Never seen a neurologist.  She reports that she has a problem with depth perception but relates this to her astigmatism.  Otherwise she has no acute neurologic concerns.  Healthcare maintenance Patient is due for Pap smear and hepatitis C screening.  Amenable to hep C today  Eczema Patient has worsening eczema in the summertime.  She is requesting a refill for Kenalog.  PERTINENT  PMH / PSH:   Obesity IIH  Patient Care Team: Alfredo Martinez, MD as PCP - General (Family Medicine) OBJECTIVE:  BP 118/78   Pulse 80   Ht 5\' 1"  (1.549 m)   Wt 288 lb 8 oz (130.9 kg)   LMP 06/08/2022   SpO2 100%   BMI 54.51 kg/m  Physical Exam  General: NAD, pleasant, able to participate in exam Card: RRR Respiratory: No respiratory distress Skin: warm and dry, no rashes noted Psych: Normal affect and mood Neuro: CN II: PERRL CN III, IV,VI: EOMI CV V: Normal sensation in face CVII: Symmetric smile and brow raise CN VIII: Normal hearing CN XI: 5/5 shoulder shrug CN XII: Symmetric tongue protrusion  UE and LE strength 5/5 Normal sensation in UE and LE bilaterally  No ataxia with finger to nose, normal heel to shin  Negative Rhomberg    ASSESSMENT/PLAN:  Idiopathic intracranial hypertension Assessment & Plan: Reassuringly with normal neurologic exam.  However, unsure if patient simply has papilledema for which she is being treated by ophthalmology or has IIH.  Regardless, Diamox has multiple possible side effects and would like  neurology to see her for assessment.  Patient has normal neurologic exam and has no neurologic complaints.  She can continue with her current dosage of Diamox at this time.  Will obtain BMP to assess lytes.  Follow-up in 3 weeks.   Orders: -     Ambulatory referral to Neurology -     Basic metabolic panel  Need for hepatitis C screening test -     Hepatitis C antibody  Eczema, unspecified type Assessment & Plan: Refill for Kenalog provided, discussed risk of hypopigmentation and limiting use.  Patient reports that she is well aware of the side effects.  No current eczematous lesions present.  Orders: -     Triamcinolone Acetonide; Apply 1 Application topically 2 (two) times daily.  Dispense: 45 g; Refill: 3   Return in about 4 weeks (around 07/13/2022) for pap smear. Alfredo Martinez, MD 06/15/2022, 10:14 AM PGY-2, [facility]

## 2022-06-15 NOTE — Patient Instructions (Addendum)
It was great to see you today! Thank you for choosing [facility] for your primary care. Rebecca Diaz was seen for follow up.  Today we addressed: -I have placed a referral to neurology  -They will call in the next two weeks -We will grab some labs today  -I will see you for your pap in 3-4 weeks, will get HPV vaccine then   If you haven't already, sign up for My Chart to have easy access to your labs results, and communication with your primary care physician.  I recommend that you always bring your medications to each appointment as this makes it easy to ensure you are on the correct medications and helps [place] not miss refills when you need them. Call the clinic at [phone] if your symptoms worsen or you have any concerns.  You should return to our clinic Return in about 4 weeks (around 07/13/2022) for pap smear. Please arrive 15 minutes before your appointment to ensure smooth check in process.  We appreciate your efforts in making this happen.  Thank you for allowing me to participate in your care, Alfredo Martinez, MD 06/15/2022, 9:42 AM PGY-2, [facility]

## 2022-06-15 NOTE — Assessment & Plan Note (Signed)
Refill for Kenalog provided, discussed risk of hypopigmentation and limiting use.  Patient reports that she is well aware of the side effects.  No current eczematous lesions present.

## 2022-06-15 NOTE — Assessment & Plan Note (Signed)
Reassuringly with normal neurologic exam.  However, unsure if patient simply has papilledema for which she is being treated by ophthalmology or has IIH.  Regardless, Diamox has multiple possible side effects and would like neurology to see her for assessment.  Patient has normal neurologic exam and has no neurologic complaints.  She can continue with her current dosage of Diamox at this time.  Will obtain BMP to assess lytes.  Follow-up in 3 weeks.

## 2022-06-16 LAB — BASIC METABOLIC PANEL
BUN/Creatinine Ratio: 13 (ref 9–23)
BUN: 12 mg/dL (ref 6–20)
CO2: 15 mmol/L — ABNORMAL LOW (ref 20–29)
Calcium: 9.5 mg/dL (ref 8.7–10.2)
Chloride: 112 mmol/L — ABNORMAL HIGH (ref 96–106)
Creatinine, Ser: 0.91 mg/dL (ref 0.57–1.00)
Glucose: 79 mg/dL (ref 70–99)
Potassium: 4.2 mmol/L (ref 3.5–5.2)
Sodium: 140 mmol/L (ref 134–144)
eGFR: 89 mL/min/{1.73_m2} (ref >59)

## 2022-06-16 LAB — HEPATITIS C ANTIBODY: Hep C Virus Ab: NONREACTIVE

## 2022-07-06 ENCOUNTER — Encounter: Payer: Self-pay | Admitting: Neurology

## 2022-07-25 ENCOUNTER — Ambulatory Visit: Payer: Medicaid Other

## 2022-08-08 ENCOUNTER — Encounter: Payer: Self-pay | Admitting: Neurology

## 2022-08-08 ENCOUNTER — Ambulatory Visit (INDEPENDENT_AMBULATORY_CARE_PROVIDER_SITE_OTHER): Payer: Medicaid Other | Admitting: Neurology

## 2022-08-08 VITALS — BP 111/66 | HR 74 | Resp 18 | Ht 61.0 in | Wt 285.0 lb

## 2022-08-08 DIAGNOSIS — G932 Benign intracranial hypertension: Secondary | ICD-10-CM | POA: Diagnosis not present

## 2022-08-08 NOTE — Patient Instructions (Signed)
Good to meet you. Proceed with formal eye exam with Dr. Sherryll Burger. Let me know if you would like me to take over acetazolamide. Follow-up in 6 months, call for any changes.

## 2022-08-08 NOTE — Progress Notes (Signed)
NEUROLOGY CONSULTATION NOTE  Rebecca Diaz MRN: [id number] DOB: 29-Mar-1995  Referring provider: Dr. Burley Saver Primary care provider: Dr. Alfredo Martinez  Reason for consult:  idiopathic intracranial hypertension  Dear Dr Miquel Dunn:  Thank you for your kind referral of Rebecca Diaz for consultation of the above symptoms. Although her history is well known to you, please allow me to reiterate it for the purpose of our medical record. She is alone in the office today. Records and images were personally reviewed where available.   HISTORY OF PRESENT ILLNESS: This is a pleasant 27 year old ambidextrous woman presenting for evaluation of Idiopathic Intracranial Hypertension (IIH). She denies having any symptoms and was going for her routine eye exam in 04/2019 when her optometrist found papilledema. She was evaluated by ophthalmologist Dr. Georges Mouse, notes unavailable for review but per ER records she was sent to the ER for an MRI due to swelling of her optic nerve. MRI brain with and without contrast no acute changes, there was partially empty sella. MRV head showed stenosis of bilateral distal transverse sinuses, constellation of findings suggestive of IIH. She had an opening pressure of 22 cm H2O in 05/2019. She was started on acetazolamide 500mg  BID. She recalls that when dose was reduced 1.5 years ago, Dr. Sherryll Burger told her "some results on eye exam changed," and dose increased back to 500mg  BID. She denies any vision changes, "just astigmatism," no vision loss. She denies any headaches, dizziness, nausea/vomiting, focal numbness/tingling/weakness. She has occasional pulsatile tinnitus that is more prominent in the winter and goes away in the summer. When she turns her head to the right, the tinnitus goes away. She initially had paresthesias on the acetazolamide, but this has resolved. She reports her last eye exam was in 2023 and "eyes looked fine," report unavailable for review. She notes some  blurred vision in the morning that gets better, possibly due to dry eye. Sleep is good.     Chemistry      Component Value Date/Time   NA 140 06/15/2022 1049   K 4.2 06/15/2022 1049   CL 112 (H) 06/15/2022 1049   CO2 15 (L) 06/15/2022 1049   BUN 12 06/15/2022 1049   CREATININE 0.91 06/15/2022 1049      Component Value Date/Time   CALCIUM 9.5 06/15/2022 1049   ALKPHOS 91 06/04/2019 1629   AST 14 06/04/2019 1629   ALT 8 06/04/2019 1629   BILITOT 0.2 06/04/2019 1629       PAST MEDICAL HISTORY: History reviewed. No pertinent past medical history.  PAST SURGICAL HISTORY: History reviewed. No pertinent surgical history.  MEDICATIONS: Current Outpatient Medications on File Prior to Visit  Medication Sig Dispense Refill   acetaZOLAMIDE (DIAMOX) 250 MG tablet Take 500 mg by mouth 2 (two) times daily.     triamcinolone cream (KENALOG) 0.1 % Apply 1 Application topically 2 (two) times daily. 45 g 3   No current facility-administered medications on file prior to visit.    ALLERGIES: No Known Allergies  FAMILY HISTORY: History reviewed. No pertinent family history.  SOCIAL HISTORY: Social History   Socioeconomic History   Marital status: Single    Spouse name: Not on file   Number of children: 0   Years of education: 16   Highest education level: Not on file  Occupational History   Not on file  Tobacco Use   Smoking status: Never   Smokeless tobacco: Never  Substance and Sexual Activity   Alcohol use: Yes  Comment: Socially    Drug use: Never   Sexual activity: Not on file  Other Topics Concern   Not on file  Social History Narrative   Right handed    Drinks caffeine   Lives with sister in dads home   Currently not employed   Two story home   Social Determinants of Health   Financial Resource Strain: Not on file  Food Insecurity: Not on file  Transportation Needs: Not on file  Physical Activity: Not on file  Stress: Not on file  Social Connections:  Not on file  Intimate Partner Violence: Not on file     PHYSICAL EXAM: Vitals:   08/08/22 1022  BP: (!) 140/90  Pulse: 74  Resp: 18  SpO2: 97%   General: No acute distress Head:  Normocephalic/atraumatic Skin/Extremities: No rash, no edema Neurological Exam: Mental status: alert and awake, no dysarthria or aphasia, Fund of knowledge is appropriate.  Attention and concentration are normal.   Cranial nerves: CN I: not tested CN II: pupils equal, round, visual fields intact CN III, IV, VI:  full range of motion, no nystagmus, no ptosis CN V: facial sensation intact CN VII: upper and lower face symmetric CN VIII: hearing intact to conversation CN XI: sternocleidomastoid and trapezius muscles intact CN XII: tongue midline Bulk & Tone: normal, no fasciculations. Motor: 5/5 throughout with no pronator drift. Sensation: intact to light touch, cold, pin, vibration sense.  No extinction to double simultaneous stimulation.  Romberg test negative Deep Tendon Reflexes: +1 throughout Cerebellar: no incoordination on finger to nose testing Gait: narrow-based and steady, able to tandem walk adequately. Tremor: none   IMPRESSION: This is a pleasant 27 year old ambidextrous woman presenting for evaluation of Idiopathic Intracranial Hypertension (IIH). She was found to have optic nerve swelling on routine eye exam. Brain MRI in 2021 showed partially empty sella and stenosis of bilateral distal transverse sinuses, constellation of findings suggestive of IIH. CSF opening pressure in 2021 was normal. She has been on Acetazolamide 500mg  BID and reports that when dose was reduced 1.5 years ago, eye exam worsened. She denies any headaches or vision changes currently. Discussed the importance of formal eye exam, and if normal, can consider reducing dose of acetazolamide again and monitor for papilledema with Ophthalmology. If eye exam continues to show papilledema, would repeat LP. We also discussed the  role of weight loss in IIH, which may also reduce need for medication. Follow-up in 6 months, call for any changes.    Thank you for allowing me to participate in the care of this patient. Please do not hesitate to call for any questions or concerns.   Patrcia Dolly, M.D.  CC: Dr. Jena Gauss, Dr. Miquel Dunn

## 2022-08-31 ENCOUNTER — Telehealth: Payer: Self-pay | Admitting: Neurology

## 2022-08-31 MED ORDER — ACETAZOLAMIDE 250 MG PO TABS: ORAL_TABLET | ORAL | 11 refills | Status: DC

## 2022-08-31 NOTE — Telephone Encounter (Signed)
Patient left a message with AN. She wants to switch her previous medicine from her previous office. Called but no answer

## 2022-08-31 NOTE — Telephone Encounter (Signed)
Pt called informed that Rx sent for Acetazolamide 250mg : take 2 tablets twice a day,

## 2022-08-31 NOTE — Telephone Encounter (Signed)
Pls let her know Rx sent for Acetazolamide 250mg : take 2 tablets twice a day, thanks

## 2022-09-05 ENCOUNTER — Other Ambulatory Visit: Payer: Self-pay

## 2022-09-05 ENCOUNTER — Encounter: Payer: Self-pay | Admitting: Family Medicine

## 2022-09-05 ENCOUNTER — Other Ambulatory Visit (HOSPITAL_COMMUNITY)
Admission: RE | Admit: 2022-09-05 | Discharge: 2022-09-05 | Disposition: A | Discharge status: Home / Self Care | Payer: Medicaid Other | Source: Ambulatory Visit | Attending: Family Medicine | Admitting: Family Medicine

## 2022-09-05 ENCOUNTER — Ambulatory Visit (INDEPENDENT_AMBULATORY_CARE_PROVIDER_SITE_OTHER): Payer: Medicaid Other | Admitting: Family Medicine

## 2022-09-05 VITALS — BP 133/63 | HR 74 | Ht 61.0 in | Wt 287.0 lb

## 2022-09-05 DIAGNOSIS — Z0000 Encounter for general adult medical examination without abnormal findings: Secondary | ICD-10-CM

## 2022-09-05 DIAGNOSIS — Z124 Encounter for screening for malignant neoplasm of cervix: Secondary | ICD-10-CM | POA: Insufficient documentation

## 2022-09-05 DIAGNOSIS — Z23 Encounter for immunization: Secondary | ICD-10-CM

## 2022-09-05 DIAGNOSIS — E282 Polycystic ovarian syndrome: Secondary | ICD-10-CM

## 2022-09-05 DIAGNOSIS — Z113 Encounter for screening for infections with a predominantly sexual mode of transmission: Secondary | ICD-10-CM

## 2022-09-05 NOTE — Progress Notes (Signed)
  Date of Visit: 09/05/2022   SUBJECTIVE:   HPI:  Rebecca Diaz presents today for a well woman exam.   Concerns today: Occasional abdominal pain, see below Periods: Bleeding about every 2 months with a distinct period.  She does have intermenstrual spotting.  Reports history of PCOS in the past.  Previously took OCPs but did not tolerate these well. Contraception: Abstinent Pelvic symptoms: Denies any pelvic pain or discharge Sexual activity: Not currently or ever STD Screening: Desires testing today Pap smear status: Due today Exercise: No regular exercise Smoking: No Alcohol: No Drugs: No Mood: No concerns Cancers in family: Aunt with breast cancer, no first-degree relatives  Abdominal pain: Has had occasional abdominal pain on her left side occurring 3-4 times a week.  Primarily occurs when she is laying on her stomach.  Lasts for about 10 minutes then fades away.  Denies any blood in her stool or weight loss.  She is not excessively concerned about this nor is it excessively severe, she just wanted to mention it.  Interestingly, has not had the pain in the last month while she has been out of her acetazolamide.  OBJECTIVE:   BP 133/63   Pulse 74   Ht 5\' 1"  (1.549 m)   Wt 287 lb (130.2 kg)   SpO2 94%   BMI 54.23 kg/m  Gen: NAD, pleasant, cooperative HEENT: normocephalic, atraumatic Heart: RRR, no murmurs Lungs: CTAB, normal work of breathing Breasts: bilateral breasts normal in appearance. No erythema, deformity, or nipple discharge. No palpable abnormal masses. No axillary lymphadenopathy.  Abdomen: soft, nontender to palpation, normoactive bowel sounds  Neuro: grossly nonfocal, speech normal GU: normal appearing external genitalia without lesions. Vagina is moist with scant amount of menstrual blood. Cervix normal in appearance. No cervical motion tenderness or tenderness on bimanual exam. No adnexal masses. Chaperone present - Rebecca Diaz CMA  ASSESSMENT/PLAN:   Health  maintenance:  -STD screening: Obtained with Pap smear today, also HIV and RPR -pap smear: Done today -mammogram: Not indicated -colon cancer screening: Discussed starting at age 27 -immunizations:  HPV #2 given today, due for third dose in October -handout given on health maintenance topics  Polycystic ovaries Reviewed concern for anovulatory bleeding leading to endometrial proliferation and risk for endometrial cancer.  Recommend she follow-up to discuss treatment options in more detail.  She is interested potentially in an IUD.  She will discuss at her upcoming visit with her PCP.  Intermittent abdominal pain Benign sounding by history as well as exam.  Consider constipation as etiology.  Recommend trial of MiraLAX when she has the pain.  Follow-up if not improving.  FOLLOW UP: Follow up in several weeks with PCP for intermenstrual spotting  [place] J. Pollie Meyer, MD [facility]

## 2022-09-05 NOTE — Patient Instructions (Signed)

## 2022-09-05 NOTE — Assessment & Plan Note (Signed)
Reviewed concern for anovulatory bleeding leading to endometrial proliferation and risk for endometrial cancer.  Recommend she follow-up to discuss treatment options in more detail.  She is interested potentially in an IUD.  She will discuss at her upcoming visit with her PCP.

## 2022-09-06 LAB — CYTOLOGY - PAP
Chlamydia: NEGATIVE
Comment: NEGATIVE
Comment: NEGATIVE
Comment: NORMAL
Diagnosis: NEGATIVE
Neisseria Gonorrhea: NEGATIVE
Trichomonas: NEGATIVE

## 2022-09-06 LAB — RPR: RPR Ser Ql: NONREACTIVE

## 2022-09-06 LAB — HIV ANTIBODY (ROUTINE TESTING W REFLEX): HIV Screen 4th Generation wRfx: NONREACTIVE

## 2022-09-19 ENCOUNTER — Encounter: Payer: Self-pay | Admitting: Student

## 2022-09-19 ENCOUNTER — Ambulatory Visit (INDEPENDENT_AMBULATORY_CARE_PROVIDER_SITE_OTHER): Payer: Medicaid Other | Admitting: Student

## 2022-09-19 VITALS — BP 148/90 | HR 93 | Ht 61.0 in | Wt 281.2 lb

## 2022-09-19 DIAGNOSIS — Z020 Encounter for examination for admission to educational institution: Secondary | ICD-10-CM

## 2022-09-19 DIAGNOSIS — Z23 Encounter for immunization: Secondary | ICD-10-CM

## 2022-09-19 DIAGNOSIS — E282 Polycystic ovarian syndrome: Secondary | ICD-10-CM | POA: Diagnosis not present

## 2022-09-19 NOTE — Progress Notes (Addendum)
  SUBJECTIVE:   CHIEF COMPLAINT / HPI:   Discuss Periods Last pap 09/06/22 NILM STI testing with HIV, RPR, Hep C negative  Interested in IUD per last note with Dr. Pollie Meyer Bleeding every 2 months with distinct periods  Previously had OCPs but did not tolerate well in high school   Has intermenstrual spotting as well  Dx with PCOS in middle school Migraines with aura: no HTN: no, h/o IIH Clotting disorders: no  Patient reports that she was told previously had an IUD discussion with faculty, IUD placement would help with cancer prevention.  She reports that she would like to get the IUD placed in a few months, around December 2024.   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    PERTINENT  PMH / PSH:  Obesity IIH PCOS patient reported    Patient Care Team: Alfredo Martinez, MD as PCP - General (Family Medicine) OBJECTIVE:  BP (!) 148/90   Pulse 93   Ht 5\' 1"  (1.549 m)   Wt 281 lb 4 oz (127.6 kg)   LMP 08/08/2022   SpO2 100%   BMI 53.14 kg/m    Vision Screening   Right eye Left eye Both eyes  Without correction 20/20 20/20 20/20   With correction     Comments: Followed by [facility]   Physical Exam  General: Alert and oriented in no apparent distress Heart: Regular rate and rhythm with no murmurs appreciated Lungs: CTA bilaterally, no wheezing Abdomen: Bowel sounds present, no abdominal pain Skin: Warm and dry Extremities: No lower extremity edema, moving all extremities, normal ambulation   ASSESSMENT/PLAN:  School health examination Assessment & Plan: Currently filled out patient's paperwork to the best of my ability, obtaining quant gold, hep B, varicella Labs.  Vision and hearing screen normal.  Will update the patient when labs returned and fill out remaining paperwork.  Orders: -     QuantiFERON-TB Gold Plus -     Hepatitis A vaccine adult IM -      Varicella Zoster Abs, IgG/IgM -     Hepatitis B surface antibody,qualitative  Polycystic ovaries Assessment & Plan: Discussed the nature of IUD placement as well as the procedure.  Patient is electing to have this performed in December 2024 as this best works with her school schedule.  Currently not sexually active.  Return in 1 week as she had an elevated blood pressure reading x 2 in clinic for nursing visit.    Return in about 1 week (around 09/26/2022) for bp check with nursing . Alfredo Martinez, MD 09/19/2022, 2:47 PM PGY-3, [facility]

## 2022-09-19 NOTE — Assessment & Plan Note (Signed)
Discussed the nature of IUD placement as well as the procedure.  Patient is electing to have this performed in December 2024 as this best works with her school schedule.  Currently not sexually active.  Return in 1 week as she had an elevated blood pressure reading x 2 in clinic for nursing visit.

## 2022-09-19 NOTE — Assessment & Plan Note (Signed)
Currently filled out patient's paperwork to the best of my ability, obtaining quant gold, hep B, varicella Labs.  Vision and hearing screen normal.  Will update the patient when labs returned and fill out remaining paperwork.

## 2022-09-19 NOTE — Patient Instructions (Addendum)
It was great to see you today! Thank you for choosing [facility] for your primary care. Rebecca Diaz was seen for follow up.  Today we addressed: Across the board, the IUD is known to lower risk for many gynecological cancers, including endometrial and ovarian cancer, but with regard to cervical cancer, the latest research suggests the benefit can be significant -- as much as a 30% reduced risk. I will let you know when the lab results return  Return in 1 week for nursing visit   If you haven't already, sign up for My Chart to have easy access to your labs results, and communication with your primary care physician.  I recommend that you always bring your medications to each appointment as this makes it easy to ensure you are on the correct medications and helps [place] not miss refills when you need them. Call the clinic at [phone] if your symptoms worsen or you have any concerns.  You should return to our clinic Return in about 1 week (around 09/26/2022) for bp check with nursing . Please arrive 15 minutes before your appointment to ensure smooth check in process.  We appreciate your efforts in making this happen.  Thank you for allowing me to participate in your care, Alfredo Martinez, MD 09/19/2022, 1:49 PM PGY-3, [facility]

## 2022-09-21 LAB — QUANTIFERON-TB GOLD PLUS
QuantiFERON Mitogen Value: >10 IU/mL
QuantiFERON Nil Value: 0.14 IU/mL
QuantiFERON TB1 Ag Value: 0.14 IU/mL
QuantiFERON TB2 Ag Value: 0.15 IU/mL
QuantiFERON-TB Gold Plus: NEGATIVE

## 2022-09-21 LAB — VARICELLA ZOSTER ABS, IGG/IGM
Varicella IgM: <0.91 index (ref 0.00–0.90)
Varicella zoster IgG: >4000 index (ref >165)

## 2022-09-21 LAB — HEPATITIS B SURFACE ANTIBODY,QUALITATIVE: Hep B Surface Ab, Qual: REACTIVE

## 2022-09-29 ENCOUNTER — Ambulatory Visit (INDEPENDENT_AMBULATORY_CARE_PROVIDER_SITE_OTHER): Payer: Medicaid Other

## 2022-09-29 VITALS — BP 119/69 | HR 90

## 2022-09-29 DIAGNOSIS — Z0130 Encounter for examination of blood pressure without abnormal findings: Secondary | ICD-10-CM

## 2022-09-30 NOTE — Progress Notes (Signed)
Patient here today for BP check.      Last BP was on 09/19/2022 and was 148/90.  BP today is 119/69 with a pulse of 90.    Checked BP in right arm with large adult cuff.    Symptoms present: none.   Patient provided with forms that had been completed by PCP. Copy made and placed in batch scanning.   Advised that patient can follow up in one year for physical or as needed.    Veronda Prude, RN

## 2023-01-17 ENCOUNTER — Telehealth: Payer: Self-pay | Admitting: Neurology

## 2023-01-17 MED ORDER — ACETAZOLAMIDE 250 MG PO TABS: ORAL_TABLET | ORAL | 1 refills | Status: DC

## 2023-01-17 NOTE — Telephone Encounter (Signed)
RX sent to [facility]

## 2023-01-17 NOTE — Telephone Encounter (Signed)
Pt would like her acetazolamide transferred to [facility]

## 2023-03-03 ENCOUNTER — Ambulatory Visit: Payer: Medicaid Other | Admitting: Neurology

## 2023-03-09 ENCOUNTER — Ambulatory Visit: Payer: Medicaid Other | Admitting: Neurology

## 2023-03-09 ENCOUNTER — Other Ambulatory Visit: Payer: Medicaid Other

## 2023-03-09 ENCOUNTER — Encounter: Payer: Self-pay | Admitting: Neurology

## 2023-03-09 VITALS — BP 125/91 | HR 89 | Ht 61.0 in | Wt 285.0 lb

## 2023-03-09 DIAGNOSIS — G932 Benign intracranial hypertension: Secondary | ICD-10-CM | POA: Diagnosis not present

## 2023-03-09 MED ORDER — ACETAZOLAMIDE 250 MG PO TABS: ORAL_TABLET | ORAL | 11 refills | Status: DC

## 2023-03-09 NOTE — Progress Notes (Signed)
 NEUROLOGY FOLLOW UP OFFICE NOTE  Rebecca Diaz Pe [id number] 1996/01/31  HISTORY OF PRESENT ILLNESS: I had the pleasure of seeing Rebecca Diaz in follow-up in the neurology clinic on 03/09/2023.  The patient was last seen 6 months ago for IIH. She is alone in the office today. Since her last visit, symptoms have been stable. She has not seen Dr. Maree due to scheduling issues, but has seen her regular optometrist with note of normal exam, no papilledema. She denies any vision changes, no headaches. She still has the right-sided tinnitus that comes and goes, it was previously a whooshing, sometimes sounds like her heart beat that changes volume depending if she did not eat or with position changes. Turning head to the right makes it go away. She denies any focal numbness/tingling/weakness. 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 She had 2 incidents of brief dizziness where things felt like they were moving slower and she was not completely balance. It lasted a few seconds, no falls. She thinks it was from low BP.    History on Initial Assessment 08/08/2022: This is a pleasant 28 year old ambidextrous woman presenting for evaluation of Idiopathic Intracranial Hypertension (IIH). She denies having any symptoms and was going for her routine eye exam in 04/2019 when her optometrist found papilledema. She was evaluated by ophthalmologist Dr. Lonni Maree, notes unavailable for review but per ER records she was sent to the ER for an MRI due to swelling of her optic nerve. MRI brain with and without contrast no acute changes, there was partially empty sella. MRV head showed stenosis of bilateral distal transverse sinuses, constellation of findings suggestive of IIH. She had an opening pressure of 22 cm H2O in 05/2019. She was started on acetazolamide  500mg  BID. She recalls that when dose was reduced  1.5 years ago, Dr. Maree told her some results on eye exam changed, and dose increased back to 500mg  BID. She denies any vision changes, just astigmatism, no vision loss. She denies any headaches, dizziness, nausea/vomiting, focal numbness/tingling/weakness. She has occasional pulsatile tinnitus that is more prominent in the winter and goes away in the summer. When she turns her head to the right, the tinnitus goes away. She initially had paresthesias on the acetazolamide , but this has resolved. She reports her last eye exam was in 2023 and eyes looked fine, report unavailable for review. She notes some blurred vision in the morning that gets better, possibly due to dry eye. Sleep is good.     Chemistry      Component Value Date/Time   NA 140 06/15/2022 1049   K 4.2 06/15/2022 1049   CL 112 (H) 06/15/2022 1049   CO2 15 (L) 06/15/2022 1049   BUN 12 06/15/2022 1049   CREATININE 0.91 06/15/2022 1049      Component Value Date/Time   CALCIUM 9.5 06/15/2022 1049   ALKPHOS 91 06/04/2019 1629   AST 14 06/04/2019 1629   ALT 8 06/04/2019 1629   BILITOT 0.2 06/04/2019 1629       PAST MEDICAL HISTORY: History reviewed. No pertinent past medical history.  MEDICATIONS: Current Outpatient Medications on File Prior to Visit  Medication Sig Dispense Refill   acetaZOLAMIDE  (DIAMOX ) 250 MG tablet Take 2 tablets twice a day 120 tablet 1   No current facility-administered medications on file prior to visit.    ALLERGIES: No Known Allergies  FAMILY HISTORY: History reviewed. No pertinent family history.  SOCIAL HISTORY: Social History   Socioeconomic History   Marital status: Single    Spouse name: Not on file   Number of children: 0   Years of education: 16   Highest education level: Not on file  Occupational History   Not on file  Tobacco Use   Smoking status: Never   Smokeless tobacco: Never  Substance and Sexual Activity   Alcohol use: Yes    Comment: Socially    Drug use:  Never   Sexual activity: Not on file  Other Topics Concern   Not on file  Social History Narrative   Right handed    Drinks caffeine   Lives with sister in dads home   Currently not employed   Two story home   Social Drivers of Health   Financial Resource Strain: Not on file  Food Insecurity: Not on file  Transportation Needs: Not on file  Physical Activity: Not on file  Stress: Not on file  Social Connections: Not on file  Intimate Partner Violence: Not on file     PHYSICAL EXAM: Vitals:   03/09/23 0947  BP: (!) 125/91  Pulse: 89  SpO2: 99%   General: No acute distress Head:  Normocephalic/atraumatic Skin/Extremities: No rash, no edema Neurological Exam: alert and awake. No aphasia or dysarthria. Fund of knowledge is appropriate.  Attention and concentration are normal.   Cranial nerves: Pupils equal, round. Extraocular movements intact with no nystagmus. Visual fields full.  No facial asymmetry.  Motor: Bulk and tone normal, muscle strength 5/5 throughout with no pronator drift.   Finger to nose testing intact.  Gait narrow-based and steady, able to tandem walk adequately.  Romberg negative.   IMPRESSION: This is a pleasant 28 yo ambidextrous woman with Idiopathic Intracranial Hypertension (IIH). She was found to have optic nerve swelling on routine eye exam in 2021. Brain MRI in 2021 showed partially empty sella and stenosis of bilateral distal transverse sinuses, constellation of findings suggestive of IIH. CSF opening pressure in 2021 was normal. When acetazolamide  dose was reduced in the past, eye exam worsened. She is back on Acetazolamide  500mg  BID without side effects. Last eye exam with optometrist no papilledema, she will be scheduling follow-up with her ophthalmologist Dr. Maree. We again discussed that if eye exam is normal, we can consider reducing Diamox  dose. For now, continue 500mg  BID, check BMP. Follow-up in 6 months, call for any changes.    Thank you for  allowing me to participate in her care.  Please do not hesitate to call for any questions or concerns.    Darice Shivers, M.D.   CC: Dr. Bryan, Dr. Maree

## 2023-03-09 NOTE — Patient Instructions (Signed)
 Good to see you.  Have bloodwork done for BMP  2. Continue Diamox 250mg : Take 2 tablets twice a day  3. Please ask both your eye doctors to send their notes  4. Follow-up in 6 months, call for any changes

## 2023-03-10 LAB — BASIC METABOLIC PANEL
BUN: 13 mg/dL (ref 7–25)
CO2: 18 mmol/L — ABNORMAL LOW (ref 20–32)
Calcium: 9.2 mg/dL (ref 8.6–10.2)
Chloride: 114 mmol/L — ABNORMAL HIGH (ref 98–110)
Creat: 0.96 mg/dL (ref 0.50–0.96)
Glucose, Bld: 75 mg/dL (ref 65–99)
Potassium: 4.4 mmol/L (ref 3.5–5.3)
Sodium: 140 mmol/L (ref 135–146)

## 2023-03-14 ENCOUNTER — Telehealth: Payer: Self-pay

## 2023-03-14 NOTE — Telephone Encounter (Signed)
 Pt called an informed that bloodwork okay

## 2023-03-14 NOTE — Telephone Encounter (Signed)
-----   Message from Van Clines sent at 03/13/2023 12:45 PM EST ----- Pls let her know bloodwork okay, thanks

## 2023-04-20 ENCOUNTER — Ambulatory Visit: Payer: Medicaid Other

## 2023-04-20 NOTE — Progress Notes (Deleted)
    SUBJECTIVE:   CHIEF COMPLAINT / HPI:   Sick symptoms:  Symptoms started *** runny nose, sore throat.  Medications tried at home *** OTC tylenol and ibuprofen.  Tmax: ***  Sick contacts: *** Signs of respiratory distress: *** Appetite: ***  Hydration: ***    PERTINENT  PMH / PSH:  Morbid obesity ? PCOS Anxiety IIH  OBJECTIVE:   There were no vitals taken for this visit.     ASSESSMENT/PLAN:   No problem-specific Assessment & Plan notes found for this encounter.  URI:  No signs of respiratory distress on exam. Patient appears well hydrated.  Most likely viral mediated, supportive therapy indicated with return precautions for trouble breathing or persistent fever.  POC tested preformed ***.   Darral Dash, DO [facility]    {    This will disappear when note is signed, click to select method of visit    :1}

## 2023-04-21 ENCOUNTER — Ambulatory Visit: Payer: Medicaid Other

## 2023-04-24 ENCOUNTER — Ambulatory Visit: Payer: Medicaid Other

## 2023-05-17 ENCOUNTER — Ambulatory Visit (INDEPENDENT_AMBULATORY_CARE_PROVIDER_SITE_OTHER): Admitting: Student

## 2023-05-17 VITALS — BP 134/80 | HR 88 | Temp 98.8°F | Ht 61.0 in | Wt 282.5 lb

## 2023-05-17 DIAGNOSIS — R052 Subacute cough: Secondary | ICD-10-CM

## 2023-05-17 DIAGNOSIS — N926 Irregular menstruation, unspecified: Secondary | ICD-10-CM | POA: Diagnosis not present

## 2023-05-17 DIAGNOSIS — E611 Iron deficiency: Secondary | ICD-10-CM

## 2023-05-17 MED ORDER — FLUTICASONE PROPIONATE 50 MCG/ACT NA SUSP: 2.0000 | Freq: Every day | NASAL | 6 refills | Status: AC

## 2023-05-17 NOTE — Patient Instructions (Addendum)
 It was great to see you! Thank you for allowing me to participate in your care!   Our plans for today:  - I am getting a chest xray for evaluation - go to [facility] [address] - I am prescribing flonase - Please schedule an appointment at front with Dr. Raymondo Band for pulmonary function testing - We will check CBC/iron today  Take care and seek immediate care sooner if you develop any concerns.  Levin Erp, MD

## 2023-05-17 NOTE — Progress Notes (Signed)
    SUBJECTIVE:   CHIEF COMPLAINT / HPI: Cough   History of suspected COVID-19 infection in end of January, presents with a persistent cough and concern for iron deficiency.  The cough has been ongoing since the end of January, initially productive with mucus, then transitioning to a dry cough.  The patient also reports a sensation of not being able to fully use her lungs, with her voice fluctuating in pitch and volume, and a feeling of running out of breath while talking. Reports a history of heavy breathing, particularly during the summer and spring, and has used an albuterol inhaler once, which helped with the sensation of shallow breathing. Multiple family members with asthma  The patient's sister, who was sick at the same time, was found to have low iron levels requiring iron infusions. The patient reports a family history of iron deficiency in all her sisters and female cousins. The patient's menstrual cycle is irregular, occurring either monthly or every two months, with heavy bleeding for the first two days, then light bleeding for the next two to three days. Does have hx of polycystic ovaries on imaging  PERTINENT  PMH / PSH: polycystic ovaries, IIH, eczema  OBJECTIVE:   BP 134/80   Pulse 88   Temp 98.8 F (37.1 C) (Oral)   Ht 5\' 1"  (1.549 m)   Wt 282 lb 8 oz (128.1 kg)   LMP 04/10/2023   SpO2 99%   BMI 53.38 kg/m   General: Well appearing, NAD, awake, alert, responsive to questions Head: Normocephalic atraumatic, nasal congestion CV: Regular rate and rhythm no murmurs rubs or gallops Respiratory: Clear to ausculation bilaterally, no wheezes rales or crackles, chest rises symmetrically,  no increased work of breathing Extremities: Moves upper and lower extremities freely, no edema in LE  ASSESSMENT/PLAN:   Assessment & Plan Subacute cough Persistent cough for six weeks, possibly due to asthma or lingering viral cough. Family history and albuterol response suggest  possible underlying asthma.  - chest x-ray  - [facility] visit for spirometry/PFTs - Prescribe Flonase for nasal congestion - ED/return precautions Abnormal menstrual periods Hx of some heavier periods. Requests iron testing today. -CBC -iron, TIBC, ferritin   Levin Erp, MD [facility]

## 2023-05-18 ENCOUNTER — Encounter: Payer: Self-pay | Admitting: Student

## 2023-05-18 LAB — IRON,TIBC AND FERRITIN PANEL
Ferritin: 22 ng/mL (ref 15–150)
Iron Saturation: 7 % — CL (ref 15–55)
Iron: 25 ug/dL — ABNORMAL LOW (ref 27–159)
Total Iron Binding Capacity: 366 ug/dL (ref 250–450)
UIBC: 341 ug/dL (ref 131–425)

## 2023-05-18 LAB — CBC
Hematocrit: 42.3 % (ref 34.0–46.6)
Hemoglobin: 12.2 g/dL (ref 11.1–15.9)
MCH: 21.2 pg — ABNORMAL LOW (ref 26.6–33.0)
MCHC: 28.8 g/dL — ABNORMAL LOW (ref 31.5–35.7)
MCV: 73 fL — ABNORMAL LOW (ref 79–97)
Platelets: 453 10*3/uL — ABNORMAL HIGH (ref 150–450)
RBC: 5.76 x10E6/uL — ABNORMAL HIGH (ref 3.77–5.28)
RDW: 20.1 % — ABNORMAL HIGH (ref 11.7–15.4)
WBC: 9.9 10*3/uL (ref 3.4–10.8)

## 2023-05-18 MED ORDER — FERROUS SULFATE 325 (65 FE) MG PO TABS: 325.0000 mg | ORAL_TABLET | ORAL | 0 refills | Status: AC

## 2023-05-18 NOTE — Addendum Note (Signed)
 Addended by: Levin Erp on: 05/18/2023 11:40 AM   Modules accepted: Orders

## 2023-05-19 ENCOUNTER — Encounter: Payer: Self-pay | Admitting: Student

## 2023-05-19 ENCOUNTER — Ambulatory Visit
Admission: RE | Admit: 2023-05-19 | Discharge: 2023-05-19 | Disposition: A | Discharge status: Home / Self Care | Source: Ambulatory Visit | Attending: Family Medicine | Admitting: Family Medicine

## 2023-05-19 DIAGNOSIS — R052 Subacute cough: Secondary | ICD-10-CM

## 2023-05-25 ENCOUNTER — Encounter: Payer: Self-pay | Admitting: Pharmacist

## 2023-05-25 ENCOUNTER — Ambulatory Visit (INDEPENDENT_AMBULATORY_CARE_PROVIDER_SITE_OTHER): Admitting: Pharmacist

## 2023-05-25 VITALS — BP 117/72 | HR 73 | Wt 285.8 lb

## 2023-05-25 DIAGNOSIS — R059 Cough, unspecified: Secondary | ICD-10-CM | POA: Insufficient documentation

## 2023-05-25 DIAGNOSIS — R058 Other specified cough: Secondary | ICD-10-CM | POA: Diagnosis not present

## 2023-05-25 NOTE — Patient Instructions (Signed)
 It was nice to see you today! Great job performing the lung function test today!  Medication Changes: Continue all other medications the same.

## 2023-05-25 NOTE — Progress Notes (Signed)
   S:     Chief Complaint  Patient presents with   Medication Management    PFT   28 y.o. female who presents for respiratory evaluation, education, and management. Patient arrives in good spirits and presents without any assistance.  Patient was referred and last seen by Primary Care Provider, Dr. Laroy Apple, on 05/17/23.   PMH is significant for cough (after suspected COVID-19 infection), polycystic ovaries, IIH, eczema.  At last visit, patient reported persistent cough for the last six weeks, and started Flonase (fluticasone) for nasal congestion.   Family history: asthma (dad, 4 sisters)  Patient reports shortness of breath during first 4 weeks following infection. After 4 weeks, patient reports continued occasional shortness of breath when coughing for longer periods of time. Notes that in the past, occurs more seasonally - allergies cause runny nose but symptoms are not drastically different between seasons. Patient's sister reports her breathing as heavy and loud.  Patient reports adherence to medications.   Patient reports history of feeling limited with activity - can climb up one flight of stairs without getting tired  O: Review of Systems  Respiratory:  Positive for cough.   All other systems reviewed and are negative.   Physical Exam Constitutional:      Appearance: Normal appearance.  Pulmonary:     Effort: Pulmonary effort is normal.  Neurological:     Mental Status: She is alert.  Psychiatric:        Mood and Affect: Mood normal.        Behavior: Behavior normal.        Thought Content: Thought content normal.        Judgment: Judgment normal.    Vitals:   05/25/23 1415  BP: 117/72  Pulse: 73  SpO2: 100%    See "scanned report" or Documentation Flowsheet (discrete results - PFTs) for  Spirometry results. Patient provided good effort while attempting spirometry.  Lung Age = 67  Patient is participating in a Managed Medicaid Plan:  Yes   A/P: Patient  has been experiencing persistent cough following COVID-19 infection in January for 7 weeks and taking no medications for cough. Recently started Flonase (fluticasone) for nasal congestion and  post-nasal drip with cough. Medication adherence good. Spirometry evaluation without bronchodilator reveals normal lung function.    Reviewed results of pulmonary function tests.  Patient verbalized understanding of treatment plan.   Written patient instructions provided.  Total time in face to face counseling 27 minutes.    Follow-up:  Pharmacist PRN PCP clinic visit PRN Patient seen with Threasa Heads, PharmD Candidate and Mack Guise, PharmD Candidate.

## 2023-05-25 NOTE — Addendum Note (Signed)
 Addended by: Kathrin Ruddy on: 05/25/2023 03:17 PM   Modules accepted: Orders

## 2023-05-25 NOTE — Assessment & Plan Note (Signed)
 Patient has been experiencing persistent cough following COVID-19 infection in January for 7 weeks and taking no medications for cough. Recently started Flonase (fluticasone) for nasal congestion and  post-nasal drip with cough. Medication adherence good. Spirometry evaluation without bronchodilator reveals normal lung function.

## 2023-05-26 NOTE — Progress Notes (Signed)
 Reviewed and agree with Dr Macky Lower plan.

## 2023-06-24 ENCOUNTER — Other Ambulatory Visit: Payer: Self-pay | Admitting: Student

## 2023-08-15 ENCOUNTER — Encounter: Payer: Self-pay | Admitting: *Deleted

## 2023-09-11 ENCOUNTER — Ambulatory Visit

## 2023-09-15 ENCOUNTER — Ambulatory Visit (INDEPENDENT_AMBULATORY_CARE_PROVIDER_SITE_OTHER): Admitting: Family Medicine

## 2023-09-15 ENCOUNTER — Encounter: Payer: Self-pay | Admitting: Family Medicine

## 2023-09-15 VITALS — BP 110/70 | HR 74 | Ht 61.0 in | Wt 287.0 lb

## 2023-09-15 DIAGNOSIS — Z0000 Encounter for general adult medical examination without abnormal findings: Secondary | ICD-10-CM

## 2023-09-15 DIAGNOSIS — E611 Iron deficiency: Secondary | ICD-10-CM

## 2023-09-15 DIAGNOSIS — L309 Dermatitis, unspecified: Secondary | ICD-10-CM | POA: Diagnosis not present

## 2023-09-15 MED ORDER — TRIAMCINOLONE ACETONIDE 0.1 % EX CREA: TOPICAL_CREAM | Freq: Two times a day (BID) | CUTANEOUS | 2 refills | Status: AC

## 2023-09-15 NOTE — Patient Instructions (Addendum)
 It was wonderful to see you today!  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 If any of the results come back abnormal, you will receive a phone call to discuss next steps. Otherwise you will receive a MyChart message with your results when they are available.  Please call [phone] with any questions about today's appointment.   If you need any additional refills, please call your pharmacy before calling the office.  Lucie Pinal, DO Family Medicine

## 2023-09-15 NOTE — Progress Notes (Signed)
    SUBJECTIVE:   CHIEF COMPLAINT / HPI:   Presenting for educational exam and routine labwork. 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  More recently her menses have been more consistent, with a predictable 5-7 day course, heaviest on day 3-4. She uses a menstrual cup, and on her heaviest days has to empty it 3-4 times.   No complaints at this time, needs a refill on her kenalog  for eczema  PERTINENT  PMH / PSH: IDA, IIH  OBJECTIVE:   BP 110/70   Pulse 74   Ht 5' 1 (1.549 m)   Wt 287 lb (130.2 kg)   LMP 08/31/2023   BMI 54.23 kg/m   General: A&O, NAD HEENT: No sign of trauma, EOM grossly intact Cardiac: RRR, no m/r/g Respiratory: CTAB, normal WOB, no w/c/r GI: Soft, NTTP, non-distended  Extremities: NTTP, no peripheral edema. Neuro: Normal gait, moves all four extremities appropriately. Psych: Appropriate mood and affect   ASSESSMENT/PLAN:   Assessment & Plan Encounter for routine adult health examination without abnormal findings -Normal physical examination for age group -not at risk for STI -UTD on pap -PHQ non-indicative of depression at this time -follow up in one year or sooner if needed Low iron -repeat CBC and Iron panel today -follow up as indicated based on test results Eczema, unspecified type -refilled kenalog    Rebecca Pinal, DO [facility]

## 2023-09-15 NOTE — Assessment & Plan Note (Signed)
-  refilled kenalog

## 2023-09-16 ENCOUNTER — Ambulatory Visit: Payer: Self-pay | Admitting: Family Medicine

## 2023-09-21 LAB — QUANTIFERON-TB GOLD PLUS
QuantiFERON Mitogen Value: >10 [IU]/mL
QuantiFERON Nil Value: 0.21 [IU]/mL
QuantiFERON TB1 Ag Value: 0.25 [IU]/mL
QuantiFERON TB2 Ag Value: 0.29 [IU]/mL

## 2023-09-21 LAB — CMP14+EGFR
ALT: 9 IU/L (ref 0–32)
AST: 15 IU/L (ref 0–40)
Albumin: 4.2 g/dL (ref 4.0–5.0)
Alkaline Phosphatase: 74 IU/L (ref 44–121)
BUN/Creatinine Ratio: 9 (ref 9–23)
BUN: 7 mg/dL (ref 6–20)
Bilirubin Total: 0.2 mg/dL (ref 0.0–1.2)
CO2: 17 mmol/L — ABNORMAL LOW (ref 20–29)
Calcium: 9.2 mg/dL (ref 8.7–10.2)
Chloride: 107 mmol/L — ABNORMAL HIGH (ref 96–106)
Creatinine, Ser: 0.79 mg/dL (ref 0.57–1.00)
Globulin, Total: 3 g/dL (ref 1.5–4.5)
Glucose: 77 mg/dL (ref 70–99)
Potassium: 3.9 mmol/L (ref 3.5–5.2)
Sodium: 140 mmol/L (ref 134–144)
Total Protein: 7.2 g/dL (ref 6.0–8.5)
eGFR: 104 mL/min/1.73 (ref >59)

## 2023-09-21 LAB — CBC
Hematocrit: 42.7 % (ref 34.0–46.6)
Hemoglobin: 12.7 g/dL (ref 11.1–15.9)
MCH: 22.6 pg — ABNORMAL LOW (ref 26.6–33.0)
MCHC: 29.7 g/dL — ABNORMAL LOW (ref 31.5–35.7)
MCV: 76 fL — ABNORMAL LOW (ref 79–97)
Platelets: 372 x10E3/uL (ref 150–450)
RBC: 5.63 x10E6/uL — ABNORMAL HIGH (ref 3.77–5.28)
RDW: 19.4 % — ABNORMAL HIGH (ref 11.7–15.4)
WBC: 8.4 x10E3/uL (ref 3.4–10.8)

## 2023-09-21 LAB — IRON,TIBC AND FERRITIN PANEL
Ferritin: 34 ng/mL (ref 15–150)
Iron Saturation: 8 % — CL (ref 15–55)
Iron: 25 ug/dL — ABNORMAL LOW (ref 27–159)
Total Iron Binding Capacity: 324 ug/dL (ref 250–450)
UIBC: 299 ug/dL (ref 131–425)

## 2023-10-06 ENCOUNTER — Ambulatory Visit: Payer: Medicaid Other | Admitting: Neurology

## 2024-01-18 ENCOUNTER — Encounter: Payer: Self-pay | Admitting: Neurology

## 2024-03-12 ENCOUNTER — Encounter: Payer: Self-pay | Admitting: Family Medicine

## 2024-03-18 ENCOUNTER — Encounter: Admitting: Family Medicine

## 2024-03-29 ENCOUNTER — Other Ambulatory Visit: Payer: Self-pay

## 2024-03-29 MED ORDER — ACETAZOLAMIDE 250 MG PO TABS: ORAL_TABLET | ORAL | 6 refills | Status: AC

## 2024-05-13 ENCOUNTER — Ambulatory Visit: Admitting: Neurology
